# Patient Record
Sex: Female | Born: 2000 | Race: Black or African American | Hispanic: No | Marital: Single | Smoking: Never smoker
Health system: Southern US, Community
[De-identification: ages and names within clinical notes are randomized; demographics above are authoritative.]

## PROBLEM LIST (undated history)

## (undated) ENCOUNTER — Emergency Department (HOSPITAL_COMMUNITY): Admission: EM | Payer: BLUE CROSS/BLUE SHIELD

## (undated) DIAGNOSIS — G932 Benign intracranial hypertension: Secondary | ICD-10-CM

## (undated) DIAGNOSIS — J45909 Unspecified asthma, uncomplicated: Secondary | ICD-10-CM

## (undated) DIAGNOSIS — F329 Major depressive disorder, single episode, unspecified: Secondary | ICD-10-CM

## (undated) DIAGNOSIS — G509 Disorder of trigeminal nerve, unspecified: Secondary | ICD-10-CM

## (undated) DIAGNOSIS — H4711 Papilledema associated with increased intracranial pressure: Secondary | ICD-10-CM

## (undated) DIAGNOSIS — F32A Depression, unspecified: Secondary | ICD-10-CM

## (undated) DIAGNOSIS — F419 Anxiety disorder, unspecified: Secondary | ICD-10-CM

## (undated) HISTORY — DX: Depression, unspecified: F32.A

## (undated) HISTORY — DX: Major depressive disorder, single episode, unspecified: F32.9

## (undated) HISTORY — DX: Anxiety disorder, unspecified: F41.9

## (undated) HISTORY — PX: NO PAST SURGERIES: SHX2092

---
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
## 2021-01-05 ENCOUNTER — Ambulatory Visit (HOSPITAL_COMMUNITY): Payer: BC Managed Care – PPO | Admitting: Licensed Clinical Social Worker

## 2021-01-26 ENCOUNTER — Ambulatory Visit (INDEPENDENT_AMBULATORY_CARE_PROVIDER_SITE_OTHER): Payer: BC Managed Care – PPO | Admitting: Licensed Clinical Social Worker

## 2021-01-26 DIAGNOSIS — F900 Attention-deficit hyperactivity disorder, predominantly inattentive type: Secondary | ICD-10-CM

## 2021-01-27 NOTE — Progress Notes (Signed)
Virtual Visit via Video Note  I connected with Anne Ball on 01/27/21 at  2:00 PM EDT by a video enabled telemedicine application and verified that I am speaking with the correct person using two identifiers.  Location: Patient: Home Provider: Office   I discussed the limitations of evaluation and management by telemedicine and the availability of in person appointments. The patient expressed understanding and agreed to proceed.   THERAPIST PROGRESS NOTE  Session Time: 2:00 pm-2:25 pm  Type of Therapy: Individual Therapy  Session #17  Purpose of Session/Treatment Goals addressed:  "[name] will manage mood as evidenced by opening up, be more present, improve motivation, interact with her family, and have realistic expectations for herself"  Interventions: Therapist utilized CBT and Solution focused brief therapy to address focus and anxiety. Therapist provided support and empathy to patient. Therapist worked with patient to identify her barriers to completing work.   Effectiveness: Patient was oriented x5 (person, place, situation, time, and object). Patient was casually dressed, and appropriately groomed. Patient was alert, engaged, pleasant, and cooperative. Patient is still working on her school work. She is trying to manage her time. Patient knows what she needs to do and that she needs to structure her time to complete her school work. Patient continues to get distracted by things around the house.   Patient engaged in session. She responded well to interventions. Patient continues to meet criteria for Anxiety, NOS and ADHD, inattentive. Patient will continue in outpatient therapy due to being the least restrictive service to meet her needs. Patient made moderate progress on her goals at this time.   Suicidal/Homicidal: Negativewithout intent/plan  Plan: Return again in 3-4 weeks.  Diagnosis: Axis I: ADHD, inattentive type and Anxiety Disorder NOS    Axis II: No  diagnosis    I discussed the assessment and treatment plan with the patient. The patient was provided an opportunity to ask questions and all were answered. The patient agreed with the plan and demonstrated an understanding of the instructions.   The patient was advised to call back or seek an in-person evaluation if the symptoms worsen or if the condition fails to improve as anticipated.  I provided 25 minutes of non-face-to-face time during this encounter.  Bynum Bellows, LCSW 01/27/2021

## 2021-02-10 ENCOUNTER — Ambulatory Visit (INDEPENDENT_AMBULATORY_CARE_PROVIDER_SITE_OTHER): Payer: BC Managed Care – PPO | Admitting: Licensed Clinical Social Worker

## 2021-02-10 DIAGNOSIS — F900 Attention-deficit hyperactivity disorder, predominantly inattentive type: Secondary | ICD-10-CM | POA: Diagnosis not present

## 2021-02-11 NOTE — Progress Notes (Signed)
Virtual Visit via Video Note  I connected with Anne Ball on 02/11/21 at  1:00 PM EDT by a video enabled telemedicine application and verified that I am speaking with the correct person using two identifiers.  Location: Patient: Home Provider: Office   I discussed the limitations of evaluation and management by telemedicine and the availability of in person appointments. The patient expressed understanding and agreed to proceed.   THERAPIST PROGRESS NOTE  Session Time: 1:00 pm-1:15 pm  Type of Therapy: Individual Therapy  Session #18  Purpose of Session/Treatment Goals addressed:  "[name] will manage mood as evidenced by opening up, be more present, improve motivation, interact with her family, and have realistic expectations for herself"  Interventions: Therapist utilized CBT and Solution focused brief therapy to address focus and anxiety. Therapist provided support and empathy to patient. Therapist worked with patient to identify barriers that have stopper her from completing her assignments.   Effectiveness: Patient was oriented x5 (person, place, situation, time, and object). Patient was casually dressed, and appropriately groomed. Patient was alert, engaged, pleasant, and cooperative. Patient noted she has a week left to finish up her school work. Patient is trying to manage her time to complete work but hasn't done well in doing so. Patient is not scheduling or structuring her time.   Patient engaged in session. She responded well to interventions. Patient continues to meet criteria for Anxiety, NOS and ADHD, inattentive. Patient will continue in outpatient therapy due to being the least restrictive service to meet her needs. Patient made moderate progress on her goals at this time.   Suicidal/Homicidal: Negativewithout intent/plan  Plan: Return again in 3-4 weeks.  Diagnosis: Axis I: ADHD, inattentive type and Anxiety Disorder NOS    Axis II: No diagnosis    I  discussed the assessment and treatment plan with the patient. The patient was provided an opportunity to ask questions and all were answered. The patient agreed with the plan and demonstrated an understanding of the instructions.   The patient was advised to call back or seek an in-person evaluation if the symptoms worsen or if the condition fails to improve as anticipated.  I provided 15 minutes of non-face-to-face time during this encounter.  Anne Bellows, LCSW 02/11/2021

## 2021-02-15 ENCOUNTER — Telehealth: Payer: Self-pay

## 2021-02-15 ENCOUNTER — Other Ambulatory Visit (HOSPITAL_COMMUNITY): Payer: Self-pay | Admitting: Psychiatry

## 2021-02-15 DIAGNOSIS — F900 Attention-deficit hyperactivity disorder, predominantly inattentive type: Secondary | ICD-10-CM

## 2021-02-15 MED ORDER — METHYLPHENIDATE HCL ER (OSM) 54 MG PO TBCR: 54.0000 mg | EXTENDED_RELEASE_TABLET | ORAL | 0 refills | Status: DC

## 2021-02-15 NOTE — Telephone Encounter (Signed)
pt mother called left message that child needs a refill on the concerta please [facility] in [place].

## 2021-02-15 NOTE — Telephone Encounter (Signed)
sent 

## 2021-02-24 ENCOUNTER — Other Ambulatory Visit: Payer: Self-pay

## 2021-02-24 ENCOUNTER — Telehealth (INDEPENDENT_AMBULATORY_CARE_PROVIDER_SITE_OTHER): Payer: BC Managed Care – PPO | Admitting: Child and Adolescent Psychiatry

## 2021-02-24 DIAGNOSIS — F900 Attention-deficit hyperactivity disorder, predominantly inattentive type: Secondary | ICD-10-CM

## 2021-02-24 DIAGNOSIS — F840 Autistic disorder: Secondary | ICD-10-CM | POA: Diagnosis not present

## 2021-02-24 DIAGNOSIS — F331 Major depressive disorder, recurrent, moderate: Secondary | ICD-10-CM

## 2021-02-24 MED ORDER — METHYLPHENIDATE HCL ER (OSM) 54 MG PO TBCR: 54.0000 mg | EXTENDED_RELEASE_TABLET | ORAL | 0 refills | Status: DC

## 2021-02-24 MED ORDER — FLUOXETINE HCL 20 MG PO TABS: 20.0000 mg | ORAL_TABLET | Freq: Every day | ORAL | 0 refills | Status: DC

## 2021-02-24 NOTE — Progress Notes (Signed)
Virtual Visit via Video Note  I connected with [name] on 02/24/21 at 10:00 AM EDT by a video enabled telemedicine application and verified that I am speaking with the correct person using two identifiers.  Location: Patient: home Provider: office   I discussed the limitations of evaluation and management by telemedicine and the availability of in person appointments. The patient expressed understanding and agreed to proceed.     I discussed the assessment and treatment plan with the patient. The patient was provided an opportunity to ask questions and all were answered. The patient agreed with the plan and demonstrated an understanding of the instructions.   The patient was advised to call back or seek an in-person evaluation if the symptoms worsen or if the condition fails to improve as anticipated.     [name], MD     [place] MD/PA/NP OP Progress Note  02/24/21 10:00 AM Ginna Schuur  MRN:  [id number]  Chief Complaint:   Medication management follow-up for anxiety, depression, ADHD.  HPI: This is a 20 year old African-American female with psychiatric history significant for major depressive disorder, anxiety, ADHD, ASD was seen and evaluated over telemedicine encounter for medication management follow-up.  Netha was accompanied with her mother at her home and was evaluated separately from her mother.  With her verbal informed consent I spoke with her mother to discuss her treatment plan and obtain collateral information.  Tanga reports that she is doing "okay".  She reports that she found out that she did not complete the credits to finish her 12th grade and therefore cannot graduate.  She reports that she found out this few days ago and she has been feeling disappointed since then. Provided refelctive and empathic listening, and validated patient's experience.  She reports that she realizes her mistake of not doing what was required and now planning to find  alternative ways to recover these credits.  She reports that she could go to a different school because she does not want to go to the same school as she did last year.  Although she reports that she has not been feeling depressed, she reports that she has been staying in her room more than usual, sleeping more than usual especially since she found out that she has not passed 12th grade.  She reports that she still eats well, sleep routine is disrupted.  She reports her anxiety is manageable.  She denies any SI/HI.  We discussed to improve her daily routine, get out of her room more, doing some physical activity such as walking while watching TV or walking outside.  She was receptive to the suggestions.  Her mother reports that since school has been out and Jodean does not have to follow any routine she is staying in her room a lot, sleeping more than usual, was supposed to finish her 8 weeks course in 4 months however only finished 25% of it and therefore did not graduate from high school.  Mother also reports that she continues to keep her room disorganized.  She reports that she is not sure how to help patient.  We discussed to establish expectations of her behaviors around home and follow it.  Mother however reports that she has been doing it for a long time without any improvement.  We discussed to engage patient in activities outside of room.  We discussed to inquire about [facility] programm for which she was approved 10 visits for therapy and we discussed that they can also help her  with vocational rehabilitation and education needs.  She reports that she will inquire about this today.  I discussed with her to increase the dose of fluoxetine to 60 mg once a day to help her with the symptoms of depression while continuing with Concerta 54 mg once a day.  Mother reports that she has to be on top of her to have her take the medications.  Based on [place] PDMP review she does not seem to be feeling up Concerta  prescription every month.  Discussed the importance of compliance to medications.  They will follow back again in 4 weeks in person or earlier if needed.  Mother is also recommended to make an appointment with pediatrician for annual physical exam and she reports that patient has an appointment with ophthalmologist for eye checkup due to history of pseudotumor cerebri.  She continues to see her therapist about every 2 weeks.  Visit Diagnosis:    ICD-10-CM   1. Moderate episode of recurrent major depressive disorder (HCC)  F33.1     2. Attention deficit hyperactivity disorder (ADHD), predominantly inattentive type  F90.0 methylphenidate (CONCERTA) 54 MG PO CR tablet    3. Autism spectrum disorder  F84.0          Past Psychiatric History: As mentioned in initial H&P, reviewed today, no change. She continues to take Prozac 40 mg once a day, Concerta 54 mg once a day and follow-up with individual therapist, started following Mr. Bynum Bellows. Past Medical History:  Past Medical History:  Diagnosis Date   Anxiety    Asthma    Depression     Past Surgical History:  Procedure Laterality Date   NO PAST SURGERIES      Family Psychiatric History: As mentioned in initial H&P, reviewed today, no change  Family History:  Family History  Problem Relation Age of Onset   Anxiety disorder Mother    Depression Mother    Diabetes Mother    Hypertension Mother    Hyperlipidemia Mother    Asthma Father    Migraines Neg Hx    Seizures Neg Hx    Autism Neg Hx    ADD / ADHD Neg Hx    Bipolar disorder Neg Hx    Schizophrenia Neg Hx     Social History:  Social History   Socioeconomic History   Marital status: Single    Spouse name: Not on file   Number of children: Not on file   Years of education: Not on file   Highest education level: 10th grade  Occupational History   Not on file  Tobacco Use   Smoking status: Never   Smokeless tobacco: Never  Vaping Use   Vaping Use: Never  used  Substance and Sexual Activity   Alcohol use: Never   Drug use: Never   Sexual activity: Never  Other Topics Concern   Not on file  Social History Narrative   Lives with mom, dad and brother. She is in the 12th grade at [employer]. She enjoys eating, sleeping, and drawing.   Social Determinants of Health   Financial Resource Strain: Not on file  Food Insecurity: Not on file  Transportation Needs: Not on file  Physical Activity: Not on file  Stress: Not on file  Social Connections: Not on file    Allergies: No Known Allergies  Metabolic Disorder Labs: Lab Results  Component Value Date   HGBA1C 5.7 08/19/2011   No results found for: PROLACTIN Lab Results  Component  Value Date   CHOL 109 08/19/2011   TRIG 48 08/19/2011   HDL 35 (L) 08/19/2011   VLDL 10 08/19/2011   LDLCALC 64 08/19/2011   Lab Results  Component Value Date   TSH 1.065 12/29/2017    Therapeutic Level Labs: No results found for: LITHIUM No results found for: VALPROATE No components found for:  CBMZ  Current Medications: Current Outpatient Medications  Medication Sig Dispense Refill   FLUoxetine (PROZAC) 20 MG tablet Take 1 tablet (20 mg total) by mouth daily. To be taken with Prozac 40 mg daily. 30 tablet 0   acetaZOLAMIDE (DIAMOX SEQUELS) 500 MG capsule Take 1 capsule (500 mg total) by mouth 2 (two) times daily. (Patient not taking: Reported on 11/19/2020) 60 capsule 6   Albuterol (VENTOLIN IN) Inhale 2 puffs into the lungs every 4 (four) hours as needed (shortness of breath).      beclomethasone (QVAR) 40 MCG/ACT inhaler Inhale 1 puff into the lungs 2 (two) times daily.     cetirizine (ZYRTEC) 10 MG tablet Take 10 mg by mouth daily as needed for allergies.  (Patient not taking: No sig reported)     Cholecalciferol (VITAMIN D3) 50 MCG (2000 UT) capsule Take 2,000 Units by mouth 2 (two) times daily. (Patient not taking: Reported on 11/19/2020)     FLUoxetine (PROZAC) 40 MG capsule Take 1  capsule (40 mg total) by mouth daily. 90 capsule 0   fluticasone (FLONASE) 50 MCG/ACT nasal spray Place 2 sprays into both nostrils daily as needed for allergies or rhinitis.   3   loratadine (CLARITIN) 10 MG tablet Take 10 mg by mouth daily as needed for allergies or rhinitis. (Patient not taking: No sig reported)     methylphenidate (CONCERTA) 54 MG PO CR tablet Take 1 tablet (54 mg total) by mouth every morning. 30 tablet 0   methylphenidate (CONCERTA) 54 MG PO CR tablet Take 1 tablet (54 mg total) by mouth every morning. 30 tablet 0   montelukast (SINGULAIR) 10 MG tablet Take 10 mg by mouth at bedtime.     No current facility-administered medications for this visit.     Musculoskeletal: Strength & Muscle Tone: unable to assess since visit was over the telemedicine. Gait & Station: unable to assess since visit was over the telemedicine. Patient leans: N/A  Psychiatric Specialty Exam:  Mental Status Exam: Appearance: casually dressed; fairly groomed; no overt signs of trauma or distress noted, obese Attitude: calm, cooperative with good eye contact Activity: No PMA/PMR, no tics/no tremors; no EPS noted  Speech: normal rate, rhythm and volume Thought Process: Logical, linear, and goal-directed.  Associations: no looseness, tangentiality, circumstantiality, flight of ideas, thought blocking or word salad noted Thought Content: (abnormal/psychotic thoughts): no abnormal or delusional thought process evidenced SI/HI: denies Si/Hi Perception: no illusions or visual/auditory hallucinations noted; no response to internal stimuli demonstrated Mood & Affect: "ok"/full range,  Judgment & Insight: both fair Attention and Concentration : Good Cognition : WNL Language : Good ADL - Intact    Screenings:   Assessment and Plan:   This is an 20 year old African-American female with psychiatric history significant for major depressive disorder, anxiety, ADHD, ASD.  She appears to have  worsening of depressive symptoms in the context of being out of routine, not being able to complete school. Appears to have stability in anxiety. She also appears to have partial compliance to meds as she is not filling up Concerta every month. She is recommended to increase Prozac to 60  mg daily, mother will call and follow up with [facility], and recommended to set expectation for pt around the home. She will follow back in 4 weeks or early if needed.      Plan as below.   #1 Depression (recurrent, mild to moderate) - Increase Prozac to 60 mg daily  - Continue ind therapy with Mr Pollyann Savoy  - She has supportive parents which is a good prognostic and protective factor.     #2 Anxiety (stable) - Her presentation appears most consistent with generalized anxiety and social anxiety disorders. ' - Recommending medication and therapy as mentioned above.    #3 ADHD (chronic and unstable) -  Continue with Concerta 54 mg once a day  -  At the time of initiation, discussed side effects including but not limited to appetite suppression, sleep disturbances, headaches, GI side effect. Mother verbalized understanding and provided informed consent.   #4 Autism (Chronic, stable) - Testing done at [facility], and diagnosed with ASD per report, has accommodations at school.  [facility] has recommended 10 sessions of therapy, On a waitlist for vocational rehab according to mother. Mother is recommended to call and follow up.    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  - Defer management to peds neuro.    >40 minutes total time for encounter today which included chart review, pt evaluation, collaterals, medication and other treatment discussions, medication orders and charting.             [name], MD       [name], MD 02/24/21, 10:30 am

## 2021-02-25 ENCOUNTER — Ambulatory Visit (INDEPENDENT_AMBULATORY_CARE_PROVIDER_SITE_OTHER): Payer: BC Managed Care – PPO | Admitting: Licensed Clinical Social Worker

## 2021-02-25 DIAGNOSIS — F900 Attention-deficit hyperactivity disorder, predominantly inattentive type: Secondary | ICD-10-CM | POA: Diagnosis not present

## 2021-02-26 NOTE — Progress Notes (Signed)
Virtual Visit via Video Note  I connected with Anne Ball on 02/26/21 at  1:00 PM EDT by a video enabled telemedicine application and verified that I am speaking with the correct person using two identifiers.  Location: Patient: Home Provider: Office   I discussed the limitations of evaluation and management by telemedicine and the availability of in person appointments. The patient expressed understanding and agreed to proceed.   THERAPIST PROGRESS NOTE  Session Time: 1:00 pm-1:45 pm  Type of Therapy: Individual Therapy  Session #19  Purpose of Session/Treatment Goals addressed:  "Anne Ball will manage mood as evidenced by opening up, be more present, improve motivation, interact with her family, and have realistic expectations for herself"  Interventions: Therapist utilized CBT and Solution focused brief therapy to address focus and anxiety. Therapist provided support and empathy to patient during session. Therapist explored patient's feelings about not passing her class and having to retake it.   Effectiveness: Patient was oriented x5 (person, place, situation, time, and object). Patient was casually dressed, and appropriately groomed. Patient was alert, engaged, pleasant, and cooperative. Patient did not pass her chemistry class. She is going to go back to school to complete the course. She has to decide where to attend. She has been hesitant to return to her old school because she feels like her school got upset with her for standing up for her friends. She is not sure if she wants to go back there but is probably going to be going there. Patient didn't feel that frustrated about not passing. She wanted to pass but also feels like she was given too much work for a short amount of time. She didn't feel like her father and brother believe in her.   Patient engaged in session. She responded well to interventions. Patient continues to meet criteria for Anxiety, NOS and ADHD, inattentive.  Patient will continue in outpatient therapy due to being the least restrictive service to meet her needs. Patient made moderate progress on her goals at this time.   Suicidal/Homicidal: Negativewithout intent/plan  Plan: Return again in 3-4 weeks.  Diagnosis: Axis I: ADHD, inattentive type and Anxiety Disorder NOS    Axis II: No diagnosis    I discussed the assessment and treatment plan with the patient. The patient was provided an opportunity to ask questions and all were answered. The patient agreed with the plan and demonstrated an understanding of the instructions.   The patient was advised to call back or seek an in-person evaluation if the symptoms worsen or if the condition fails to improve as anticipated.  I provided 20 minutes of non-face-to-face time during this encounter.  Bynum Bellows, LCSW 02/26/2021

## 2021-03-16 ENCOUNTER — Ambulatory Visit (INDEPENDENT_AMBULATORY_CARE_PROVIDER_SITE_OTHER): Payer: BC Managed Care – PPO | Admitting: Licensed Clinical Social Worker

## 2021-03-16 DIAGNOSIS — F900 Attention-deficit hyperactivity disorder, predominantly inattentive type: Secondary | ICD-10-CM | POA: Diagnosis not present

## 2021-03-17 NOTE — Progress Notes (Signed)
Virtual Visit via Video Note  I connected with Anne Ball on 03/17/21 at 11:00 AM EDT by a video enabled telemedicine application and verified that I am speaking with the correct person using two identifiers.  Location: Patient: Home Provider: Office   I discussed the limitations of evaluation and management by telemedicine and the availability of in person appointments. The patient expressed understanding and agreed to proceed.   THERAPIST PROGRESS NOTE  Session Time: 11:00 am-11:20 am  Type of Therapy: Individual Therapy  Session #20  Purpose of Session/Treatment Goals addressed:  "Anne Ball will manage mood as evidenced by opening up, be more present, improve motivation, interact with her family, and have realistic expectations for herself"  Interventions: Therapist utilized CBT and Solution focused brief therapy to address focus and anxiety. Therapist provided support and empathy to patient during session. Therapist worked with patient to identify steps she could take take more ownership over her life.   Effectiveness: Patient was oriented x5 (person, place, situation, time, and object). Patient was casually dressed, and appropriately groomed. Patient was alert, engaged, pleasant, and cooperative. Patient noted that things have been the same. Her mood and anxiety are stable. Patient has not started school yet. She has limited information on her appointments, etc. Patient is going to ask her mother for more responsibility over her own medical information including appointments.   Patient engaged in session. She responded well to interventions. Patient continues to meet criteria for Anxiety, NOS and ADHD, inattentive. Patient will continue in outpatient therapy due to being the least restrictive service to meet her needs. Patient made moderate progress on her goals at this time.   Suicidal/Homicidal: Negativewithout intent/plan  Plan: Return again in 3-4 weeks.  Diagnosis: Axis I:  ADHD, inattentive type and Anxiety Disorder NOS    Axis II: No diagnosis    I discussed the assessment and treatment plan with the patient. The patient was provided an opportunity to ask questions and all were answered. The patient agreed with the plan and demonstrated an understanding of the instructions.   The patient was advised to call back or seek an in-person evaluation if the symptoms worsen or if the condition fails to improve as anticipated.  I provided 20 minutes of non-face-to-face time during this encounter.  Bynum Bellows, LCSW 03/17/2021

## 2021-03-28 ENCOUNTER — Other Ambulatory Visit: Payer: Self-pay | Admitting: Child and Adolescent Psychiatry

## 2021-03-29 ENCOUNTER — Encounter: Payer: Self-pay | Admitting: Child and Adolescent Psychiatry

## 2021-03-29 ENCOUNTER — Other Ambulatory Visit: Payer: Self-pay

## 2021-03-29 ENCOUNTER — Ambulatory Visit (INDEPENDENT_AMBULATORY_CARE_PROVIDER_SITE_OTHER): Payer: BC Managed Care – PPO | Admitting: Child and Adolescent Psychiatry

## 2021-03-29 VITALS — BP 144/90 | HR 137 | Temp 98.2°F | Wt 247.4 lb

## 2021-03-29 DIAGNOSIS — F331 Major depressive disorder, recurrent, moderate: Secondary | ICD-10-CM | POA: Diagnosis not present

## 2021-03-29 DIAGNOSIS — F418 Other specified anxiety disorders: Secondary | ICD-10-CM | POA: Diagnosis not present

## 2021-03-29 DIAGNOSIS — F840 Autistic disorder: Secondary | ICD-10-CM

## 2021-03-29 DIAGNOSIS — F900 Attention-deficit hyperactivity disorder, predominantly inattentive type: Secondary | ICD-10-CM

## 2021-03-29 DIAGNOSIS — Z68.41 Body mass index (BMI) pediatric, greater than or equal to 95th percentile for age: Secondary | ICD-10-CM

## 2021-03-29 MED ORDER — METHYLPHENIDATE HCL ER (OSM) 54 MG PO TBCR: 54.0000 mg | EXTENDED_RELEASE_TABLET | ORAL | 0 refills | Status: DC

## 2021-03-29 MED ORDER — FLUOXETINE HCL 40 MG PO CAPS: 40.0000 mg | ORAL_CAPSULE | Freq: Every day | ORAL | 0 refills | Status: DC

## 2021-03-29 MED ORDER — FLUOXETINE HCL 20 MG PO TABS: 20.0000 mg | ORAL_TABLET | Freq: Every day | ORAL | 0 refills | Status: DC

## 2021-03-29 NOTE — Progress Notes (Signed)
BH MD/PA/NP OP Progress Note  03/29/21 10:00 AM Anne Ball  MRN:  [id number]  Chief Complaint:  Chief Complaint   Follow-up   Medication management follow-up for anxiety, depression, ADHD.  HPI: This is an 20 year old African-American female with psychiatric history significant for major depressive disorder, anxiety, ADHD, ASD was seen and evaluated in office for medication management follow-up.  Anne Ball was accompanied with her mother, and was evaluated separately from her mother and with her verbal informed consent I spoke with her mother to obtain collateral information and discuss the treatment plan.    Anne Ball reports that she is doing "okay".  She reports that not much has changed since her appointment about a month ago.  She reports that she still spends a lot of time in her room, sleeps excessively because things are boring for her and her sleep schedule is also not in routine, reports that she started drawing again which she enjoys at times.  She reports that she watches YouTube videos which helps her learn different things.  She reports that overall her mood has been "pretty good", denies any low lows or feeling depressed.  She does report some anhedonia.  In regards of eating she reports that she often overeats.  She denies any suicidal thoughts or homicidal thoughts.  She reports that her anxiety is not much.  She reports that she spends most of the time at home and barely goes out.  She reports that she has feelings of disappointment because of not able to complete her high school last year.  Her mother provides collateral information and corroborates all areas reported.  Mother reports that things have not been different as compared to last appointment.  She reports that today she woke up, took a shower and got ready but usually she stays at home, her sleep routine is not regular, she often stays up late at night, eats a lot during the night, gained about 20 to 25 pounds in the  last 1 to 1-1/2-year.  Mother reports that she is not compliant to her medication every day and misses a few days every week.  Anne Ball disagreed with this however writer also pointed out that they are not picking up Concerta prescription every 30 days and therefore most likely she is missing some days of her medications.  She was receptive to this.  We discussed the importance of medication adherence.  She verbalized understanding.  Mother offered $1 reward every day that she takes her medication.  Patient agreed with this.  Anne Ball denies any problems with increased dose of fluoxetine after the last appointment.  We discussed her elevated blood pressure today, recommended mother to make an appointment with primary care since they have not had any annual physical this year.  Discussed with mother regarding her blood pressure today.  Mother reports that patient has an appointment with ophthalmology at the end of the month, she is still taking Diamox and she has to find an adult neurologist to follow up with that.  I discussed with patient regarding healthy eating and some exercise.  Mother offered reward to have her walk outside.  Patient agreed with this.  Anne Ball  continues to see her therapist, about every 3 to 4 weeks but it appears that therapist will be able to see her every 2 weeks after December.  We discussed to continue with current medications given her partial adherence to her medications.  Also discussed that Concerta can elevate blood pressure and heart rate and therefore strongly  recommended them to follow up with primary care regarding blood pressure and further interventions for that.  We also discussed referral to nutritionist and I will send a referral to [facility] for [facility].  Mother reports that she has reached out to [employer] and they have an online program which will allow her to take 1 subject and graduate high school.  Mother also reports that she looked at [facility] teach program  website and noticed that they have her different group programs.  Mother was strongly recommended to call and inquire about this so that it can provide more activity to patient.  Mother verbalized understanding.  They will follow back again in a month or earlier if needed.  Visit Diagnosis:    ICD-10-CM   1. Moderate episode of recurrent major depressive disorder (HCC)  F33.1     2. Attention deficit hyperactivity disorder (ADHD), predominantly inattentive type  F90.0 methylphenidate (CONCERTA) 54 MG PO CR tablet    3. Autism spectrum disorder  F84.0     4. Other specified anxiety disorders  F41.8           Past Psychiatric History: As mentioned in initial H&P, reviewed today, no change. She continues to take Prozac 40 mg once a day, Concerta 54 mg once a day and follow-up with individual therapist, started following Mr. Bynum Bellows. Past Medical History:  Past Medical History:  Diagnosis Date   Anxiety    Asthma    Depression     Past Surgical History:  Procedure Laterality Date   NO PAST SURGERIES      Family Psychiatric History: As mentioned in initial H&P, reviewed today, no change  Family History:  Family History  Problem Relation Age of Onset   Anxiety disorder Mother    Depression Mother    Diabetes Mother    Hypertension Mother    Hyperlipidemia Mother    Asthma Father    Migraines Neg Hx    Seizures Neg Hx    Autism Neg Hx    ADD / ADHD Neg Hx    Bipolar disorder Neg Hx    Schizophrenia Neg Hx     Social History:  Social History   Socioeconomic History   Marital status: Single    Spouse name: Not on file   Number of children: Not on file   Years of education: Not on file   Highest education level: 10th grade  Occupational History   Not on file  Tobacco Use   Smoking status: Never   Smokeless tobacco: Never  Vaping Use   Vaping Use: Never used  Substance and Sexual Activity   Alcohol use: Never   Drug use: Never   Sexual activity: Never   Other Topics Concern   Not on file  Social History Narrative   Lives with mom, dad and brother. She is in the 12th grade at [employer]. She enjoys eating, sleeping, and drawing.   Social Determinants of Health   Financial Resource Strain: Not on file  Food Insecurity: Not on file  Transportation Needs: Not on file  Physical Activity: Not on file  Stress: Not on file  Social Connections: Not on file    Allergies: No Known Allergies  Metabolic Disorder Labs: Lab Results  Component Value Date   HGBA1C 5.7 08/19/2011   No results found for: PROLACTIN Lab Results  Component Value Date   CHOL 109 08/19/2011   TRIG 48 08/19/2011   HDL 35 (L) 08/19/2011   VLDL  10 08/19/2011   LDLCALC 64 08/19/2011   Lab Results  Component Value Date   TSH 1.065 12/29/2017    Therapeutic Level Labs: No results found for: LITHIUM No results found for: VALPROATE No components found for:  CBMZ  Current Medications: Current Outpatient Medications  Medication Sig Dispense Refill   Albuterol (VENTOLIN IN) Inhale 2 puffs into the lungs every 4 (four) hours as needed (shortness of breath).      beclomethasone (QVAR) 40 MCG/ACT inhaler Inhale 1 puff into the lungs 2 (two) times daily.     Cholecalciferol (VITAMIN D3) 50 MCG (2000 UT) capsule Take 2,000 Units by mouth 2 (two) times daily.     fluticasone (FLONASE) 50 MCG/ACT nasal spray Place 2 sprays into both nostrils daily as needed for allergies or rhinitis.   3   montelukast (SINGULAIR) 10 MG tablet Take 10 mg by mouth at bedtime.     FLUoxetine (PROZAC) 20 MG tablet Take 1 tablet (20 mg total) by mouth daily. To be taken with Prozac 40 mg daily. 30 tablet 0   FLUoxetine (PROZAC) 40 MG capsule Take 1 capsule (40 mg total) by mouth daily. 90 capsule 0   methylphenidate (CONCERTA) 54 MG PO CR tablet Take 1 tablet (54 mg total) by mouth every morning. 30 tablet 0   No current facility-administered medications for this visit.      Musculoskeletal: Strength & Muscle Tone: WNL Gait & Station: Normal Patient leans: N/A  Psychiatric Specialty Exam:  Mental Status Exam: Appearance: casually dressed; well groomed; no overt signs of trauma or distress noted Attitude: calm, cooperative with good eye contact Activity: No PMA/PMR, no tics/no tremors; no EPS noted  Speech: normal rate, rhythm and volume Thought Process: Logical, linear, and goal-directed.  Associations: no looseness, tangentiality, circumstantiality, flight of ideas, thought blocking or word salad noted Thought Content: (abnormal/psychotic thoughts): no abnormal or delusional thought process evidenced SI/HI: denies Si/Hi Perception: no illusions or visual/auditory hallucinations noted; no response to internal stimuli demonstrated Mood & Affect: "good"/restricted Judgment & Insight: both fair Attention and Concentration : Good Cognition : WNL Language : Good ADL - Intact    Screenings:   Assessment and Plan:   This is an 20 year old African-American female with psychiatric history significant for major depressive disorder, anxiety, ADHD, ASD.  She appears to have continued depressive symptoms in the context of being out of routine, not being able to complete school. Appears to have stability in anxiety. She also appears to have partial compliance to meds as she is not filling up Concerta every month. She is recommended to continue with current meds and improve adherence at this time. She is also recommended to make appointment with primary care due to elevated blood pressure and heart rate that were noted today.  She had seen a cardiologist in the past for similar reasons and did not recommend any intervention at that time.  Mother agreed to make an appointment with primary care.  She also has an appointment with ophthalmologist and we will also get a referral for a new neurologist since she is now 19 from her primary care.  Mother will call and  follow up with [facility], and also working to get her enrolled in [employer] on line high school graduation program. She will follow back in 4 weeks or early if needed.      Plan as below.   #1 Depression (recurrent, mild to moderate) - Continue Prozac 60 mg daily and improve compliance  - Continue  ind therapy with Mr Pollyann Savoy  - She has supportive parents which is a good prognostic and protective factor.     #2 Anxiety (stable) - Her presentation appears most consistent with generalized anxiety and social anxiety disorders. ' - Recommending medication and therapy as mentioned above.    #3 ADHD (chronic and unstable) -  Continue with Concerta 54 mg once a day  -  At the time of initiation, discussed side effects including but not limited to appetite suppression, sleep disturbances, headaches, GI side effect. Mother verbalized understanding and provided informed consent.   #4 Autism (Chronic, stable) - [facility] done at [facility] and diagnosed with ASD per report, has accommodations at school.  [facility] has recommended 10 sessions of therapy, On a waitlist for vocational rehab according to mother. Mother is recommended to call and follow up.    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 Mother however reports that they have continued with diamox and planning to find a different neuro.  - Defer management to neuro.  - Referral to nutrition service sent due to obesity.    >40 minutes total time for encounter today which included chart review, pt evaluation, collaterals, medication and other treatment discussions, medication orders and charting.             [name], MD       [name], MD 03/29/21, 10:30 am

## 2021-04-01 ENCOUNTER — Ambulatory Visit (INDEPENDENT_AMBULATORY_CARE_PROVIDER_SITE_OTHER): Payer: BC Managed Care – PPO | Admitting: Licensed Clinical Social Worker

## 2021-04-01 DIAGNOSIS — F900 Attention-deficit hyperactivity disorder, predominantly inattentive type: Secondary | ICD-10-CM

## 2021-04-01 NOTE — Progress Notes (Addendum)
Virtual Visit via Video Note  I connected with Anne Ball on 04/01/21 at  1:00 PM EST by a video enabled telemedicine application and verified that I am speaking with the correct person using two identifiers.  Location: Patient: Home Provider: Office   I discussed the limitations of evaluation and management by telemedicine and the availability of in person appointments. The patient expressed understanding and agreed to proceed.   THERAPIST PROGRESS NOTE  Session Time: 1:00 pm-1:30 pm  Type of Therapy: Individual Therapy  Session #21  Purpose of Session/Treatment Goals addressed:  "Marliss will manage mood as evidenced by opening up, be more present, improve motivation, interact with her family, and have realistic expectations for herself"  Interventions: Therapist utilized CBT and Solution focused brief therapy to address focus and anxiety. Therapist provided support and empathy to patient during session. Therapist explored patient's sleep schedule and activity level.   Effectiveness: Patient was oriented x5 (person, place, situation, time, and object). Patient was alert, engaged, pleasant, and cooperative. Patient noted things have been stable. She is sleeping a lot. She is sleeping more in the day and not so much at night. Patient understood that it is important to regulate her sleep.Patient understood that she should be getting out of her room, her home, and walking. Patient is going to work on being more active and regulate sleep. Patient is going to take her medication early in the morning to help regulate sleep.   Patient engaged in session. She responded well to interventions. Patient continues to meet criteria for Anxiety, NOS and ADHD, inattentive. Patient will continue in outpatient therapy due to being the least restrictive service to meet her needs. Patient made moderate progress on her goals at this time.   Suicidal/Homicidal: Negativewithout intent/plan  Plan: Return  again in 3-4 weeks.  Diagnosis: Axis I: ADHD, inattentive type and Anxiety Disorder NOS    Axis II: No diagnosis    I discussed the assessment and treatment plan with the patient. The patient was provided an opportunity to ask questions and all were answered. The patient agreed with the plan and demonstrated an understanding of the instructions.   The patient was advised to call back or seek an in-person evaluation if the symptoms worsen or if the condition fails to improve as anticipated.  I provided 15 minutes of non-face-to-face time during this encounter.  Bynum Bellows, LCSW 04/01/2021

## 2021-04-22 ENCOUNTER — Telehealth: Payer: Self-pay

## 2021-04-22 NOTE — Telephone Encounter (Signed)
Patient prefers mother to schedule her appts with cardiology.  Ok to discuss information with her per patient verbal .  04-22-21 [employer]

## 2021-04-23 ENCOUNTER — Ambulatory Visit (INDEPENDENT_AMBULATORY_CARE_PROVIDER_SITE_OTHER): Payer: BC Managed Care – PPO | Admitting: Licensed Clinical Social Worker

## 2021-04-23 DIAGNOSIS — F900 Attention-deficit hyperactivity disorder, predominantly inattentive type: Secondary | ICD-10-CM | POA: Diagnosis not present

## 2021-04-24 NOTE — Progress Notes (Signed)
Virtual Visit via Video Note  I connected with Anne Ball on 04/24/21 at 10:00 AM EST by a video enabled telemedicine application and verified that I am speaking with the correct person using two identifiers.  Location: Patient: Home Provider: Office   I discussed the limitations of evaluation and management by telemedicine and the availability of in person appointments. The patient expressed understanding and agreed to proceed.   THERAPIST PROGRESS NOTE  Session Time: 10:00 am-10:30 am  Type of Therapy: Individual Therapy  Session #22  Purpose of Session/Treatment Goals addressed:  "Anne Ball will manage mood as evidenced by opening up, be more present, improve motivation, interact with her family, and have realistic expectations for herself"  Interventions: Therapist utilized CBT and Solution focused brief therapy to address focus and anxiety. Therapist provided support and empathy to patient during session. Therapist had patient identify what has gone well with her mood, school, and interaction with other.   Effectiveness: Patient was oriented x5 (person, place, situation, time, and object). Patient was alert, engaged, pleasant, and cooperative. Patient was casually dressed, and appropriately groomed. Patient noted that things are going well. She had a good Thanksgiving and saw family. Patient reported that her mother found her a school to attend to finish school and she will have the chance to take a different science instead of chemistry. Patient is interacting with her family more and continuing to do art.   Patient engaged in session. She responded well to interventions. Patient continues to meet criteria for Anxiety, NOS and ADHD, inattentive. Patient will continue in outpatient therapy due to being the least restrictive service to meet her needs. Patient made moderate progress on her goals at this time.   Suicidal/Homicidal: Negativewithout intent/plan  Plan: Return again in  3-4 weeks.  Diagnosis: Axis I: ADHD, inattentive type and Anxiety Disorder NOS    Axis II: No diagnosis    I discussed the assessment and treatment plan with the patient. The patient was provided an opportunity to ask questions and all were answered. The patient agreed with the plan and demonstrated an understanding of the instructions.   The patient was advised to call back or seek an in-person evaluation if the symptoms worsen or if the condition fails to improve as anticipated.  I provided 30 minutes of non-face-to-face time during this encounter.  Bynum Bellows, LCSW 04/24/2021

## 2021-04-26 ENCOUNTER — Ambulatory Visit: Payer: BC Managed Care – PPO | Admitting: Child and Adolescent Psychiatry

## 2021-04-26 ENCOUNTER — Ambulatory Visit (INDEPENDENT_AMBULATORY_CARE_PROVIDER_SITE_OTHER): Payer: BC Managed Care – PPO | Admitting: Child and Adolescent Psychiatry

## 2021-04-26 ENCOUNTER — Encounter: Payer: Self-pay | Admitting: Child and Adolescent Psychiatry

## 2021-04-26 ENCOUNTER — Other Ambulatory Visit: Payer: Self-pay

## 2021-04-26 VITALS — BP 143/85 | HR 108 | Wt 245.8 lb

## 2021-04-26 DIAGNOSIS — F418 Other specified anxiety disorders: Secondary | ICD-10-CM | POA: Diagnosis not present

## 2021-04-26 DIAGNOSIS — F840 Autistic disorder: Secondary | ICD-10-CM | POA: Diagnosis not present

## 2021-04-26 DIAGNOSIS — F900 Attention-deficit hyperactivity disorder, predominantly inattentive type: Secondary | ICD-10-CM

## 2021-04-26 DIAGNOSIS — F331 Major depressive disorder, recurrent, moderate: Secondary | ICD-10-CM

## 2021-04-26 MED ORDER — FLUOXETINE HCL 20 MG PO TABS: 20.0000 mg | ORAL_TABLET | Freq: Every day | ORAL | 0 refills | Status: DC

## 2021-04-26 MED ORDER — METHYLPHENIDATE HCL ER (OSM) 54 MG PO TBCR: 54.0000 mg | EXTENDED_RELEASE_TABLET | ORAL | 0 refills | Status: DC

## 2021-04-26 MED ORDER — FLUOXETINE HCL 40 MG PO CAPS: 40.0000 mg | ORAL_CAPSULE | Freq: Every day | ORAL | 0 refills | Status: DC

## 2021-04-26 NOTE — Progress Notes (Signed)
BH MD/PA/NP OP Progress Note  04/26/21 10:00 AM Anne Ball  MRN:  [id number]  Chief Complaint:  Chief Complaint   Follow-up    Medication management follow-up for anxiety, depression, ADHD.  HPI: This is an 20 year old African-American female with psychiatric history significant for major depressive disorder, anxiety, ADHD, ASD was seen and evaluated in office for medication management follow-up.  Anne Ball was accompanied with her mother, and was evaluated separately from her mother and with her verbal informed consent I spoke with her mother to obtain collateral information and discuss the treatment plan.    Anne Ball reports that she is doing "pretty good".  When asked what has been going good she reports that nothing bad has happened which is good and she recently got to spend some time with her grandmother and had a good time during the Thanksgiving.  She reports that she occasionally gets depressed depending on the events around her otherwise her mood has been "pretty good".  She denies any low lows or depressed mood for prolonged periods of times.  She does report that she continues to spend a lot of time in her room, sleeps a lot, does have occasional feelings of worthlessness and lack of motivation.  She denies any suicidal thoughts or homicidal thoughts.  She reports that her anxiety did go up when she was thinking about going to [employer].  Apparently she went for orientation in [employer] for her chemistry class today and it went okay.  She reports that she is also interested in one of the technical education class they offer.  She reports that she has been compliant to her medications and reward system appears to be working well for her.  She gets $1 every day she takes her medications from her mother.  She reports that she has not noticed any significant change since she has been compliant to her medications and also did not notice any side effects from them.  She continues to see a  therapist about every 2 to 3 weeks.  Her mother denies any new concerns for today's appointment.  She updates that she has reached out to [facility] and working with them to identify programs for patient.  She also reports that patient is being referred for high blood pressure and endocrinology due to A1c of 6.  Mother reports that she has not noticed significant change since she restarted taking medications on a daily basis.  We talked about other behaviors that needs to modification which includes cleaning her room.  We discussed different strategies to implement behavioral plan to motivate patient clean her room.  Anne Ball shared that she can work on this and her mother agreed to provide reward for every day she claims one thing out of her room.  We discussed that they sleep together and decide which things Anne Ball needs to clean up from her room.  They verbalized understanding and agreed to work on this.  We discussed to continue with current medications for now and adjust medication if needed at the next appointment.  They will follow back again in 6 weeks or earlier if needed.   Visit Diagnosis:    ICD-10-CM   1. Other specified anxiety disorders  F41.8     2. Attention deficit hyperactivity disorder (ADHD), predominantly inattentive type  F90.0 methylphenidate (CONCERTA) 54 MG PO CR tablet    3. Moderate episode of recurrent major depressive disorder (HCC)  F33.1     4. Autism spectrum disorder  F84.0  Past Psychiatric History: As mentioned in initial H&P, reviewed today, no change. She continues to take Prozac 40 mg once a day, Concerta 54 mg once a day and follow-up with individual therapist, started following Mr. Bynum Bellows. Past Medical History:  Past Medical History:  Diagnosis Date   Anxiety    Asthma    Depression     Past Surgical History:  Procedure Laterality Date   NO PAST SURGERIES      Family Psychiatric History: As mentioned in initial H&P,  reviewed today, no change  Family History:  Family History  Problem Relation Age of Onset   Anxiety disorder Mother    Depression Mother    Diabetes Mother    Hypertension Mother    Hyperlipidemia Mother    Asthma Father    Migraines Neg Hx    Seizures Neg Hx    Autism Neg Hx    ADD / ADHD Neg Hx    Bipolar disorder Neg Hx    Schizophrenia Neg Hx     Social History:  Social History   Socioeconomic History   Marital status: Single    Spouse name: Not on file   Number of children: Not on file   Years of education: Not on file   Highest education level: 10th grade  Occupational History   Not on file  Tobacco Use   Smoking status: Never   Smokeless tobacco: Never  Vaping Use   Vaping Use: Never used  Substance and Sexual Activity   Alcohol use: Never   Drug use: Never   Sexual activity: Never  Other Topics Concern   Not on file  Social History Narrative   Lives with mom, dad and brother. She is in the 12th grade at [employer]. She enjoys eating, sleeping, and drawing.   Social Determinants of Health   Financial Resource Strain: Not on file  Food Insecurity: Not on file  Transportation Needs: Not on file  Physical Activity: Not on file  Stress: Not on file  Social Connections: Not on file    Allergies: No Known Allergies  Metabolic Disorder Labs: Lab Results  Component Value Date   HGBA1C 5.7 08/19/2011   No results found for: PROLACTIN Lab Results  Component Value Date   CHOL 109 08/19/2011   TRIG 48 08/19/2011   HDL 35 (L) 08/19/2011   VLDL 10 08/19/2011   LDLCALC 64 08/19/2011   Lab Results  Component Value Date   TSH 1.065 12/29/2017    Therapeutic Level Labs: No results found for: LITHIUM No results found for: VALPROATE No components found for:  CBMZ  Current Medications: Current Outpatient Medications  Medication Sig Dispense Refill   Albuterol (VENTOLIN IN) Inhale 2 puffs into the lungs every 4 (four) hours as needed  (shortness of breath).      beclomethasone (QVAR) 40 MCG/ACT inhaler Inhale 1 puff into the lungs 2 (two) times daily.     Cholecalciferol (VITAMIN D3) 50 MCG (2000 UT) capsule Take 2,000 Units by mouth 2 (two) times daily.     fluticasone (FLONASE) 50 MCG/ACT nasal spray Place 2 sprays into both nostrils daily as needed for allergies or rhinitis.   3   montelukast (SINGULAIR) 10 MG tablet Take 10 mg by mouth at bedtime.     FLUoxetine (PROZAC) 20 MG tablet Take 1 tablet (20 mg total) by mouth daily. To be taken with Prozac 40 mg daily. 30 tablet 0   FLUoxetine (PROZAC) 40 MG capsule Take 1 capsule (  40 mg total) by mouth daily. 90 capsule 0   methylphenidate (CONCERTA) 54 MG PO CR tablet Take 1 tablet (54 mg total) by mouth every morning. 30 tablet 0   No current facility-administered medications for this visit.     Musculoskeletal: Strength & Muscle Tone: WNL Gait & Station: Normal Patient leans: N/A  Psychiatric Specialty Exam: Today's Vitals   04/26/21 1431  BP: (!) 143/85  Pulse: (!) 108  Weight: 245 lb 12.8 oz (111.5 kg)   Body mass index is 51.25 kg/m.   Mental Status Exam: Appearance: casually dressed; obese; fairly groomed; no overt signs of trauma or distress noted Attitude: calm, cooperative with good eye contact Activity: No PMA/PMR, no tics/no tremors; no EPS noted  Speech: normal rate, rhythm and volume Thought Process: Logical, linear, and goal-directed.  Associations: no looseness, tangentiality, circumstantiality, flight of ideas, thought blocking or word salad noted Thought Content: (abnormal/psychotic thoughts): no abnormal or delusional thought process evidenced SI/HI: denies Si/Hi Perception: no illusions or visual/auditory hallucinations noted; no response to internal stimuli demonstrated Mood & Affect: "good"/full range Judgment & Insight: both fair Attention and Concentration : Good Cognition : WNL Language : Good ADL - Intact      Screenings:   Assessment and Plan:   This is an 20 year old African-American female with psychiatric history significant for major depressive disorder, anxiety, ADHD, ASD.  She appears to have improved compliance to the medication, has mild improvement with her mood however continues to struggle with lack of motivation, occasional feelings of worthlessness.  She does seem to be starting her remaining high school class which would allow her to have some structured activities.  Mother is also trying to get resources from [facility].  At this time recommended to continue with current medications.  She continues to see her therapist about every 2 to 3 weeks.  Her blood pressure was elevated today as well and she is now referred by her pediatrician to a cardiologist and also to endocrinologist for elevated A1c.  They will follow back with me again in 6 weeks currently if needed.       Plan as below.   #1 Depression (recurrent, mild to moderate) - Continue Prozac 60 mg daily and improve compliance  - Continue ind therapy with Mr Pollyann Savoy  - She has supportive parents which is a good prognostic and protective factor.     #2 Anxiety (stable) - Her presentation appears most consistent with generalized anxiety and social anxiety disorders. ' - Recommending medication and therapy as mentioned above.    #3 ADHD (chronic and unstable) -  Continue with Concerta 54 mg once a day  -  At the time of initiation, discussed side effects including but not limited to appetite suppression, sleep disturbances, headaches, GI side effect. Mother verbalized understanding and provided informed consent.   #4 Autism (Chronic, stable) - Testing done at [facility], and diagnosed with ASD per report, has accommodations at school.  [facility] has recommended 10 sessions of therapy, On a waitlist for vocational rehab according to mother. Mother is following up with them.    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 Mother however reports that they have continued with diamox and planning to find a different neuro.  - Defer management to neuro.  - Referral to nutrition service sent due to obesity.    30 minutes total  time for encounter today which included chart review, pt evaluation, collaterals, medication and other treatment discussions, family counseling, medication orders and charting.            [name], MD       [name], MD 04/26/21, 10:30 am

## 2021-05-05 ENCOUNTER — Ambulatory Visit (HOSPITAL_COMMUNITY): Payer: BC Managed Care – PPO | Admitting: Licensed Clinical Social Worker

## 2021-05-25 ENCOUNTER — Ambulatory Visit (INDEPENDENT_AMBULATORY_CARE_PROVIDER_SITE_OTHER): Payer: BC Managed Care – PPO | Admitting: Licensed Clinical Social Worker

## 2021-05-25 DIAGNOSIS — F900 Attention-deficit hyperactivity disorder, predominantly inattentive type: Secondary | ICD-10-CM

## 2021-05-26 NOTE — Progress Notes (Signed)
Virtual Visit via Video Note  I connected with Anne Ball on 05/26/21 at  3:00 PM EST by a video enabled telemedicine application and verified that I am speaking with the correct person using two identifiers.  Location: Patient: Home Provider: Office   I discussed the limitations of evaluation and management by telemedicine and the availability of in person appointments. The patient expressed understanding and agreed to proceed.   THERAPIST PROGRESS NOTE  Session Time: 3:00 pm-3:30 pm  Type of Therapy: Individual Therapy  Session #23  Purpose of Session/Treatment Goals addressed:  "Anne Ball will manage mood as evidenced by opening up, be more present, improve motivation, interact with her family, and have realistic expectations for herself"  Interventions: Therapist utilized CBT and Solution focused brief therapy to address focus and anxiety. Therapist provided support and empathy to patient. Therapist explored patient's feelings to identify triggers. Therapist had patient identify areas she needs to work on to improve her overall wellness.   Effectiveness: Patient was oriented x5 (person, place, situation, time, and object). Patient was casually dressed, and appropriately groomed. Patient was alert, engaged, pleasant, and cooperative. Patient had a good holiday. She enjoyed seeing family. She is waiting to start her new school program to finish school up. Patient is looking forward to completing school and knows she needs to manage her time by setting aside time to do her school work. She also admits that she is not taking care of herself physically. She knows she needs to move more and go for a walk.   Patient engaged in session. She responded well to interventions. Patient continues to meet criteria for Anxiety, NOS and ADHD, inattentive. Patient will continue in outpatient therapy due to being the least restrictive service to meet her needs. Patient made moderate progress on her goals  at this time.   Suicidal/Homicidal: Negativewithout intent/plan  Plan: Return again in 3-4 weeks.  Diagnosis: Axis I: ADHD, inattentive type and Anxiety Disorder NOS    Axis II: No diagnosis    I discussed the assessment and treatment plan with the patient. The patient was provided an opportunity to ask questions and all were answered. The patient agreed with the plan and demonstrated an understanding of the instructions.   The patient was advised to call back or seek an in-person evaluation if the symptoms worsen or if the condition fails to improve as anticipated.  I provided 30 minutes of non-face-to-face time during this encounter.  Glori Bickers, LCSW 05/26/2021

## 2021-05-31 ENCOUNTER — Encounter: Payer: Self-pay | Admitting: Cardiovascular Disease

## 2021-05-31 ENCOUNTER — Ambulatory Visit: Payer: BC Managed Care – PPO | Admitting: Cardiovascular Disease

## 2021-05-31 ENCOUNTER — Other Ambulatory Visit: Payer: Self-pay

## 2021-05-31 VITALS — BP 120/72 | HR 86 | Ht <= 58 in | Wt 250.5 lb

## 2021-05-31 DIAGNOSIS — F3341 Major depressive disorder, recurrent, in partial remission: Secondary | ICD-10-CM

## 2021-05-31 DIAGNOSIS — F900 Attention-deficit hyperactivity disorder, predominantly inattentive type: Secondary | ICD-10-CM | POA: Diagnosis not present

## 2021-05-31 DIAGNOSIS — Z01810 Encounter for preprocedural cardiovascular examination: Secondary | ICD-10-CM

## 2021-05-31 DIAGNOSIS — R030 Elevated blood-pressure reading, without diagnosis of hypertension: Secondary | ICD-10-CM

## 2021-05-31 NOTE — Patient Instructions (Signed)
Medication Instructions:  No changes  If you need a refill on your cardiac medications before your next appointment, please call your pharmacy.   Lab work: No new labs needed  Testing/Procedures: No new testing needed  Follow-Up: At [facility], you and your health needs are our priority.  As part of our continuing mission to provide you with exceptional heart care, we have created designated Provider Care Teams.  These Care Teams include your primary Cardiologist (physician) and Advanced Practice Providers (APPs -  Physician Assistants and Nurse Practitioners) who all work together to provide you with the care you need, when you need it.  You will need a follow up appointment as needed  Providers on your designated Care Team:   Christopher Berge, NP Ryan Dunn, PA-C Cadence Furth, PA-C  COVID-19 Vaccine Information can be found at: [email or web address] For questions related to vaccine distribution or appointments, please email [email or web address] or call [phone].    

## 2021-05-31 NOTE — Progress Notes (Signed)
Cardiology Office Note  Date:  05/31/2021   ID:  Anne Ball, DOB 01-31-2001, MRN [id number]  PCP:  Anne Newport, NP   Chief Complaint  Patient presents with   New Patient (Initial Visit)    Ref by Anne Ganja, NP for HTN. "Doing well." Medications reviewed by the patient's mother Anne Ball) verbally.     HPI:  Anne Ball is a 21 year old woman with past medical history of Attention deficit hyperactivity disorder autism spectrum disorder, ADHD, asthma, pseudotumor cerebri,  Referred by Anne Ball for consultation of her elevated blood pressure without diagnosis of hypertension, morbid obesity  [name] reports that she is very inactive, No regular exercise program, stays mainly in the house Weight has been trending upwards, notes indicate 20 pound weight gain over the past 6 months Likes to do artistic things, arts and crafts, less outdoor stuff  Denies any chest pain or shortness of breath, no significant leg swelling Has not been checking blood pressure at home Several blood pressures in the computer is 120 systolic over 70-80  Blood pressure today 120/70 She does own a blood pressure cuff, just does not use it  EKG personally reviewed by myself on todays visit Normal sinus rhythm rate 86 bpm no significant ST-T wave changes   PMH:   has a past medical history of Anxiety, Asthma, and Depression.  PSH:    Past Surgical History:  Procedure Laterality Date   NO PAST SURGERIES      Current Outpatient Medications  Medication Sig Dispense Refill   Albuterol (VENTOLIN IN) Inhale 2 puffs into the lungs every 4 (four) hours as needed (shortness of breath).      beclomethasone (QVAR) 40 MCG/ACT inhaler Inhale 1 puff into the lungs 2 (two) times daily.     Cholecalciferol (VITAMIN D3) 50 MCG (2000 UT) capsule Take 2,000 Units by mouth 2 (two) times daily.     FLUoxetine (PROZAC) 20 MG tablet Take 1 tablet (20 mg total) by mouth daily. To be taken with Prozac 40  mg daily. 30 tablet 0   FLUoxetine (PROZAC) 40 MG capsule Take 1 capsule (40 mg total) by mouth daily. 90 capsule 0   methylphenidate (CONCERTA) 54 MG PO CR tablet Take 1 tablet (54 mg total) by mouth every morning. 30 tablet 0   montelukast (SINGULAIR) 10 MG tablet Take 10 mg by mouth at bedtime.     fluticasone (FLONASE) 50 MCG/ACT nasal spray Place 2 sprays into both nostrils daily as needed for allergies or rhinitis.  (Patient not taking: Reported on 05/31/2021)  3   No current facility-administered medications for this visit.     Allergies:   Patient has no known allergies.   Social History:  The patient  reports that she has never smoked. She has never used smokeless tobacco. She reports that she does not drink alcohol and does not use drugs.   Family History:   family history includes Anxiety disorder in her mother; Asthma in her father; Depression in her mother; Diabetes in her mother; Hyperlipidemia in her mother; Hypertension in her mother.    Review of Systems: ROS   PHYSICAL EXAM: VS:  BP 120/72 (BP Location: Right Arm, Patient Position: Sitting, Cuff Size: Large)    Pulse 86    Ht 4\' 10"  (1.473 m)    Wt 250 lb 8 oz (113.6 kg)    SpO2 99%    BMI 52.35 kg/m  , BMI Body mass index is 52.35 kg/m. GEN: Well  nourished, well developed, in no acute distress HEENT: normal Neck: no JVD, carotid bruits, or masses Cardiac: RRR; no murmurs, rubs, or gallops,no edema  Respiratory:  clear to auscultation bilaterally, normal work of breathing GI: soft, nontender, nondistended, + BS MS: no deformity or atrophy Skin: warm and dry, no rash Neuro:  Strength and sensation are intact Psych: euthymic mood, full affect  Recent Labs: No results found for requested labs within last 8760 hours.    Lipid Panel Lab Results  Component Value Date   CHOL 109 08/19/2011   HDL 35 (L) 08/19/2011   LDLCALC 64 08/19/2011   TRIG 48 08/19/2011      Wt Readings from Last 3 Encounters:  05/31/21  250 lb 8 oz (113.6 kg) (>99 %, Z= 2.48)*  11/19/20 231 lb 14.8 oz (105.2 kg) (99 %, Z= 2.31)*  04/20/20 (!) 220 lb 14.4 oz (100.2 kg) (99 %, Z= 2.21)*   * Growth percentiles are based on CDC (Girls, 2-20 Years) data.    ASSESSMENT AND PLAN:  Problem List Items Addressed This Visit     Recurrent major depressive disorder, in partial remission (HCC)   Attention deficit hyperactivity disorder (ADHD), predominantly inattentive type   Other Visit Diagnoses     Elevated blood pressure reading    -  Primary   Relevant Orders   EKG 12-Lead   Pre-procedural cardiovascular examination          Concern for elevated blood pressure Blood pressure well controlled today No clear documentation of high blood pressures Recommend she start monitoring blood pressure at home, discussed blood pressure guidelines Would not start medications based off today's numbers, no further cardiac work-up needed Prior echocardiogram reviewed, normal study --We have recommended weight loss, lifestyle modification, exercise program  autism spectrum disorder, ADHD, asthma, pseudotumor cerebri,  Followed by psychiatry  Morbid obesity We have encouraged continued exercise, careful diet management in an effort to lose weight.   Total encounter time more than 60 minutes  Greater than 50% was spent in counseling and coordination of care with the patient  Patient was seen in consultation for Anne Ball and will be referred back to her office for ongoing care of the issues detailed above  Signed, Dossie Arbour, M.D., Ph.D. [facility] [place], [place] [phone]

## 2021-06-05 ENCOUNTER — Other Ambulatory Visit: Payer: Self-pay | Admitting: Child and Adolescent Psychiatry

## 2021-06-07 ENCOUNTER — Ambulatory Visit (INDEPENDENT_AMBULATORY_CARE_PROVIDER_SITE_OTHER): Payer: BC Managed Care – PPO | Admitting: Child and Adolescent Psychiatry

## 2021-06-07 ENCOUNTER — Other Ambulatory Visit: Payer: Self-pay

## 2021-06-07 VITALS — BP 108/74 | HR 86 | Temp 97.1°F | Ht <= 58 in | Wt 249.0 lb

## 2021-06-07 DIAGNOSIS — F840 Autistic disorder: Secondary | ICD-10-CM | POA: Diagnosis not present

## 2021-06-07 DIAGNOSIS — F418 Other specified anxiety disorders: Secondary | ICD-10-CM

## 2021-06-07 DIAGNOSIS — F900 Attention-deficit hyperactivity disorder, predominantly inattentive type: Secondary | ICD-10-CM | POA: Diagnosis not present

## 2021-06-07 DIAGNOSIS — F3341 Major depressive disorder, recurrent, in partial remission: Secondary | ICD-10-CM | POA: Diagnosis not present

## 2021-06-07 MED ORDER — METHYLPHENIDATE HCL ER (OSM) 54 MG PO TBCR: 54.0000 mg | EXTENDED_RELEASE_TABLET | ORAL | 0 refills | Status: DC

## 2021-06-07 NOTE — Progress Notes (Signed)
BH MD/PA/NP OP Progress Note  06/07/21 9:00 AM Anne Ball  MRN:  [id number]  Chief Complaint:    Medication management follow-up for ADHD, anxiety, depression.  HPI: This is an 21 year old African-American female with psychiatric history significant for major depressive disorder, anxiety, ADHD, ASD was seen and evaluated in office for medication management follow-up.  Anne Ball was accompanied with her mother and was seen and evaluated jointly with her mother and alone.  She has signed written consent to have her mother involved in her treatment.  Anne Ball denies any new concerns for today's appointment.  She reports that she lost her cat about 3 days ago and since then she has been feeling sad and crying often.  She reports that she was very close to her cat. Provided refelctive and empathic listening, and validated patient's experience.  She reports that prior to that she was doing well, denied feeling depressed however has been spending a lot of time in her room.  She reports that she is spending time in her room to do her schoolwork and also play animal Crossing.  She reports that she sleeps mostly at night, denies excessive sleepiness.  She denies any suicidal thoughts or homicidal thoughts.  She continues to struggle with cleaning her room.  She reports that her anxiety has been higher since her cat died 3 days ago however prior to that anxiety has been minimal.  She reports that she has been compliant with her medications except the last 3 days.  Her mother corroborates that since the cat died she has been more tearful, and sad.  She has been communicating with her to express her feelings and Anne Ball reports that it has been helpful.  Mother reports that around her birthday and Christmas she was more social but since then she has been staying inside her room more.  She reports 1 incident in which Anne Ball demanded that they go out to get the food at 9:00 p.m.  I discussed with Anne Ball about  working on managing the distress if things does not go her way.  We discussed to think and come self down.  She was receptive to this.  In regards of medications we discussed to continue with current medications.  She has seen a cardiologist for elevated blood pressure.  Her blood pressure was normal as well as her EKG at that appointment and she was recommended to have follow-up as needed.  She has an appointment with endocrinology for elevated hemoglobin A1c this month.  She continues to see her therapist about every 2 weeks.  She has started going to college to finish her high school, does work from home, has been able to pay attention and reports that she is doing well with that.  Visit Diagnosis:    ICD-10-CM   1. Autism spectrum disorder  F84.0     2. Attention deficit hyperactivity disorder (ADHD), predominantly inattentive type  F90.0 methylphenidate (CONCERTA) 54 MG PO CR tablet    3. Other specified anxiety disorders  F41.8     4. Recurrent major depressive disorder, in partial remission (HCC)  F33.41             Past Psychiatric History: As mentioned in initial H&P, reviewed today, no change. She continues to take Prozac 40 mg once a day, Concerta 54 mg once a day and follow-up with individual therapist, started following Mr. Bynum Bellows. Past Medical History:  Past Medical History:  Diagnosis Date   Anxiety    Asthma  Depression     Past Surgical History:  Procedure Laterality Date   NO PAST SURGERIES      Family Psychiatric History: As mentioned in initial H&P, reviewed today, no change  Family History:  Family History  Problem Relation Age of Onset   Anxiety disorder Mother    Depression Mother    Diabetes Mother    Hypertension Mother    Hyperlipidemia Mother    Asthma Father    Migraines Neg Hx    Seizures Neg Hx    Autism Neg Hx    ADD / ADHD Neg Hx    Bipolar disorder Neg Hx    Schizophrenia Neg Hx     Social History:  Social History    Socioeconomic History   Marital status: Single    Spouse name: Not on file   Number of children: Not on file   Years of education: Not on file   Highest education level: 10th grade  Occupational History   Not on file  Tobacco Use   Smoking status: Never   Smokeless tobacco: Never  Vaping Use   Vaping Use: Never used  Substance and Sexual Activity   Alcohol use: Never   Drug use: Never   Sexual activity: Never  Other Topics Concern   Not on file  Social History Narrative   Lives with mom, dad and brother. She is in the 12th grade at [employer]. She enjoys eating, sleeping, and drawing.   Social Determinants of Health   Financial Resource Strain: Not on file  Food Insecurity: Not on file  Transportation Needs: Not on file  Physical Activity: Not on file  Stress: Not on file  Social Connections: Not on file    Allergies: No Known Allergies  Metabolic Disorder Labs: Lab Results  Component Value Date   HGBA1C 5.7 08/19/2011   No results found for: PROLACTIN Lab Results  Component Value Date   CHOL 109 08/19/2011   TRIG 48 08/19/2011   HDL 35 (L) 08/19/2011   VLDL 10 08/19/2011   LDLCALC 64 08/19/2011   Lab Results  Component Value Date   TSH 1.065 12/29/2017    Therapeutic Level Labs: No results found for: LITHIUM No results found for: VALPROATE No components found for:  CBMZ  Current Medications: Current Outpatient Medications  Medication Sig Dispense Refill   Albuterol (VENTOLIN IN) Inhale 2 puffs into the lungs every 4 (four) hours as needed (shortness of breath).      beclomethasone (QVAR) 40 MCG/ACT inhaler Inhale 1 puff into the lungs 2 (two) times daily.     Cholecalciferol (VITAMIN D3) 50 MCG (2000 UT) capsule Take 2,000 Units by mouth 2 (two) times daily.     FLUoxetine (PROZAC) 20 MG tablet TAKE 1 TABLET (20 MG TOTAL) BY MOUTH DAILY. TO BE TAKEN WITH PROZAC 40 MG DAILY. 30 tablet 0   FLUoxetine (PROZAC) 40 MG capsule Take 1 capsule (40  mg total) by mouth daily. 90 capsule 0   fluticasone (FLONASE) 50 MCG/ACT nasal spray Place 2 sprays into both nostrils daily as needed for allergies or rhinitis.  (Patient not taking: Reported on 05/31/2021)  3   methylphenidate (CONCERTA) 54 MG PO CR tablet Take 1 tablet (54 mg total) by mouth every morning. 30 tablet 0   montelukast (SINGULAIR) 10 MG tablet Take 10 mg by mouth at bedtime.     No current facility-administered medications for this visit.     Musculoskeletal: Strength & Muscle Tone: WNL Gait &  Station: Normal Patient leans: N/A  Psychiatric Specialty Exam: Today's Vitals   06/07/21 0859  BP: 108/74  Pulse: 86  Temp: (!) 97.1 F (36.2 C)  TempSrc: Temporal  SpO2: 97%  Weight: 249 lb (112.9 kg)  Height: 4\' 10"  (1.473 m)  PainSc: 0-No pain    Body mass index is 52.04 kg/m.  Mental Status Exam: Appearance: casually dressed; fairly groomed; obese; no overt signs of trauma or distress noted Attitude: calm, cooperative with fair eye contact Activity: No PMA/PMR, no tics/no tremors; no EPS noted  Speech: normal rate, rhythm and volume Thought Process: Logical, linear, and goal-directed.  Associations: no looseness, tangentiality, circumstantiality, flight of ideas, thought blocking or word salad noted Thought Content: (abnormal/psychotic thoughts): no abnormal or delusional thought process evidenced SI/HI: denies Si/Hi Perception: no illusions or visual/auditory hallucinations noted; no response to internal stimuli demonstrated Mood & Affect: "good"/full range, neutral Judgment & Insight: both fair Attention and Concentration : Good Cognition : WNL Language : Good ADL - Intact     Screenings:   Assessment and Plan:   This is an 21 year old African-American female with psychiatric history significant for major depressive disorder, anxiety, ADHD, ASD.  She appears to have improved compliance to the medication, appears to have mild improvement with her mood,  doing school through [employer] to graduate HS. Continues to see therapist every 2 weeks. They will follow back with me again in 6 weeks currently if needed.       Plan as below.   #1 Depression (recurrent, mild) - Continue Prozac 60 mg daily and improve compliance  - Continue ind therapy with Mr Pollyann SavoySheets  - She has supportive parents which is a good prognostic and protective factor.     #2 Anxiety (stable) - Her presentation appears most consistent with generalized anxiety and social anxiety disorders. ' - Recommending medication and therapy as mentioned above.    #3 ADHD (chronic and unstable) -  Continue with Concerta 54 mg once a day  -  At the time of initiation, discussed side effects including but not limited to appetite suppression, sleep disturbances, headaches, GI side effect. Mother verbalized understanding and provided informed consent.   #4 Autism (Chronic, stable) - Testing done at [facility], and diagnosed with ASD per report, has accommodations at school.  [facility] has recommended 10 sessions of therapy, On a waitlist for vocational rehab according to mother. Mother to follow up up with them, has not done it yet, encouraged her to touch base with them.    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 Mother however reports that they have continued with diamox and planning to find a different neuro.  - Defer management to neuro.  - Referral to nutrition service sent due to obesity, but has not followed.    30 minutes total time for encounter today which included chart review, pt evaluation, collaterals, medication and other treatment discussions, family counseling, medication orders and charting.            Darcel SmallingHiren M Jashay Roddy, MD       Darcel SmallingHiren M Ameliana Brashear, MD 06/07/21, 10:30 am

## 2021-06-08 ENCOUNTER — Ambulatory Visit (INDEPENDENT_AMBULATORY_CARE_PROVIDER_SITE_OTHER): Payer: BC Managed Care – PPO | Admitting: Licensed Clinical Social Worker

## 2021-06-08 DIAGNOSIS — F840 Autistic disorder: Secondary | ICD-10-CM

## 2021-06-08 DIAGNOSIS — F900 Attention-deficit hyperactivity disorder, predominantly inattentive type: Secondary | ICD-10-CM

## 2021-06-09 NOTE — Progress Notes (Signed)
Virtual Visit via Video Note  I connected with [name] on 06/09/21 at  2:00 PM EST by a video enabled telemedicine application and verified that I am speaking with the correct person using two identifiers.  Location: Patient: Home Provider: Office   I discussed the limitations of evaluation and management by telemedicine and the availability of in person appointments. The patient expressed understanding and agreed to proceed.   THERAPIST PROGRESS NOTE  Session Time: 2:00 pm-2:25 pm  Type of Therapy: Individual Therapy  Session #24  Purpose of Session/Treatment Goals addressed:  "Joreen will manage mood as evidenced by opening up, be more present, improve motivation, interact with her family, and have realistic expectations for herself"  Interventions: Therapist utilized CBT and Solution focused brief therapy to address focus and anxiety. Therapist provided support and empathy to patient during session. Therapist explored triggers for mood. Therapist had patient identify what has gone well and what she needs to focus on to be successful.   Effectiveness: Patient was oriented x5 (person, place, situation, time, and object). Patient was casually dressed, and appropriately groomed. Patient was alert, engaged, pleasant, and cooperative. Patient noted that things have been good overall but she did lose her pet cat. Her cat got injured and had to be put down which upset her. Patient has started school. She is doing well so far with school. Patient is liking her new science class now and feels like she will do well. Patient is going to focus on her time management to complete school work.   Patient engaged in session. She responded well to interventions. Patient continues to meet criteria for Anxiety, NOS and ADHD, inattentive. Patient will continue in outpatient therapy due to being the least restrictive service to meet her needs. Patient made moderate progress on her goals at this time.    Suicidal/Homicidal: Negativewithout intent/plan  Plan: Return again in 3-4 weeks.  Diagnosis: Axis I: ADHD, inattentive type and Anxiety Disorder NOS    Axis II: No diagnosis    I discussed the assessment and treatment plan with the patient. The patient was provided an opportunity to ask questions and all were answered. The patient agreed with the plan and demonstrated an understanding of the instructions.   The patient was advised to call back or seek an in-person evaluation if the symptoms worsen or if the condition fails to improve as anticipated.  I provided 25 minutes of non-face-to-face time during this encounter.  Bynum Bellows, LCSW 06/09/2021

## 2021-06-22 ENCOUNTER — Ambulatory Visit (INDEPENDENT_AMBULATORY_CARE_PROVIDER_SITE_OTHER): Payer: BC Managed Care – PPO | Admitting: Licensed Clinical Social Worker

## 2021-06-22 DIAGNOSIS — F900 Attention-deficit hyperactivity disorder, predominantly inattentive type: Secondary | ICD-10-CM | POA: Diagnosis not present

## 2021-06-23 NOTE — Progress Notes (Signed)
Virtual Visit via Video Note  I connected with Anne Ball on 06/23/21 at  3:00 PM EST by a video enabled telemedicine application and verified that I am speaking with the correct person using two identifiers.  Location: Patient: Home Provider: Office   I discussed the limitations of evaluation and management by telemedicine and the availability of in person appointments. The patient expressed understanding and agreed to proceed.   THERAPIST PROGRESS NOTE  Session Time: 2:50 pm-3:15 pm  Type of Therapy: Individual Therapy  Session #25  Purpose of Session/Treatment Goals addressed:  "[place] will manage mood as evidenced by opening up, be more present, improve motivation, interact with her family, and have realistic expectations for herself"  Interventions: Therapist utilized CBT and Solution focused brief therapy to address focus and anxiety. Therapist provided support and empathy to patient during session. Therapist explored patient's mood to identify triggers. Therapist worked with patient to identify what works for her to complete school work and using anxiety as a Regulatory affairs officer.   Effectiveness: Patient was oriented x5 (person, place, situation, time, and object). Patient was casually dressed, and appropriately groomed. Patient was alert, engaged, pleasant, and cooperative. Patient denied depressed mood. She was feeling better related to the passing of her cat. Patient has some mild anxiety about school but she understood that she needs to use her anxiety as a motivator and not an obstacle. She understood that anxiety can motivate her to study, and stay on top of her work. Patient is enjoying her classes and can receive money for completing her school work. If she finishes all of her course work in the next week she can get $100 from the school, and after that it drops to $50. Patient is confident in her ability to finish school.   Patient engaged in session. She responded well to  interventions. Patient continues to meet criteria for Anxiety, NOS and ADHD, inattentive. Patient will continue in outpatient therapy due to being the least restrictive service to meet her needs. Patient made moderate progress on her goals at this time.   Suicidal/Homicidal: Negativewithout intent/plan  Plan: Return again in 3-4 weeks.  Diagnosis: Axis I: ADHD, inattentive type and Anxiety Disorder NOS    Axis II: No diagnosis    I discussed the assessment and treatment plan with the patient. The patient was provided an opportunity to ask questions and all were answered. The patient agreed with the plan and demonstrated an understanding of the instructions.   The patient was advised to call back or seek an in-person evaluation if the symptoms worsen or if the condition fails to improve as anticipated.  I provided 25 minutes of non-face-to-face time during this encounter.  Bynum Bellows, LCSW 06/23/2021

## 2021-07-01 ENCOUNTER — Other Ambulatory Visit: Payer: Self-pay

## 2021-07-01 ENCOUNTER — Encounter: Payer: Self-pay | Admitting: Dietician

## 2021-07-01 ENCOUNTER — Encounter: Payer: BC Managed Care – PPO | Attending: Pediatrics | Admitting: Dietician

## 2021-07-01 VITALS — Ht 59.0 in | Wt 257.6 lb

## 2021-07-01 DIAGNOSIS — Z6841 Body Mass Index (BMI) 40.0 and over, adult: Secondary | ICD-10-CM | POA: Diagnosis not present

## 2021-07-01 DIAGNOSIS — F840 Autistic disorder: Secondary | ICD-10-CM | POA: Diagnosis not present

## 2021-07-01 DIAGNOSIS — R030 Elevated blood-pressure reading, without diagnosis of hypertension: Secondary | ICD-10-CM

## 2021-07-01 NOTE — Progress Notes (Signed)
Medical Nutrition Therapy: Visit start time: 0930  end time: 1030  Assessment:  Diagnosis: obesity, elevated BP Past medical history: autism, mild asthma, ADHD, pseudotumor cerebri Psychosocial issues/ stress concerns: autism, ADHD, depression  Preferred learning method:  Auditory (discussion) Visual Hands-on   Current weight: 257.6 (with shoes, items in pockets)     Height: 4'11" BMI: 52  Medications, supplements: patient has not been taking her medications recently (per mother)  Progress and evaluation:  Patient and mother report significant weight gain since Anne Ball has begun taking online classes to complete her high school education. She is walking and moving less.  Parents have been encouraging her to exercise, but she has not been motivated to do so.  Lives with both parents and a brother.  Anne Ball prepares lunch meal for herself, eats dinner with family but sometimes prepares her own plate from frozen convenience foods if she does not like what her mom has cooked.   Physical activity: no regular activity, occasionally walks dog  Dietary Intake:  Usual eating pattern includes 2 meals and 1 snacks per day. Dining out frequency: 2 meals per week.  Breakfast: usually skips Snack: none Lunch: 12-2pm frozen foods chicken nuggets, fries/ pizza/ other foods Snack: none Supper: mom cooks -- ie pasta; otherwise frozen foods; occ out [employer] sandwich/ nuggets and fries Snack: sometimes nighttime eating if slept late; frozen foods Beverages: water about 64oz daily, rarely soda (loves sodas)  Nutrition Care Education: Topics covered:  Basic nutrition: basic food groups, appropriate nutrient balance, appropriate meal and snack schedule, general nutrition guidelines    Weight control: importance of low sugar and low fat choices; portion control strategies including starting with smaller portions, incorporating low carb vegetables; choosing grilled and baked options vs fried, breaded;  limiting added fats ie Ranch dressing; role of exercise/ activity and options for increasing daily activity Blood pressure:  importance of controlling BP, identifying high sodium foods, identifying food sources of potassium, magnesium Other lifestyle changes:  benefits of making changes, increasing motivation, readiness for change, identifying habits that need to change  Nutritional Diagnosis:  [place]-2.1 Inpaired nutrition utilization As related to elevated BP.  As evidenced by previous BP readings. [place]-3.3 Overweight/obesity As related to excess calories, inadequate physical activity.  As evidenced by patient with current BMI of 52.  Intervention:  Instruction and discussion as noted above. Patient voices some resistance to making lifestyle changes. Discussed making gradual changes, using incentives/ rewards, working with current preferences and personal goals to boost motivation. Established goals for change with input from patient and mother  Education Materials given:  Designer, industrial/product with food lists [employer] (benefits of exercise handout) 100 Ways to add 2000 Steps handout Visit summary with goals/ instructions   Learner/ who was taught:  Patient  Family member: mother Anne Ball   Level of understanding: Verbalizes/ demonstrates competency   Demonstrated degree of understanding via:   Teach back Learning barriers: Motivation low, autism spectrum disorder  Willingness to learn/ readiness for change: Hesitance, contemplating change   Monitoring and Evaluation:  Dietary intake, exercise, BP control, and body weight      follow up:  08/27/21 at 9:15am

## 2021-07-01 NOTE — Patient Instructions (Signed)
Eat smaller portions of foods by cooking less, added less to the plate.  Add low carb vegetable like celery, carrot sticks, or green beans and/or a fruit Choose grilled meats over breaded and fried.  Use only small amounts of ranch when needed for dipping veggies. Take dog(s) out for walks, think of other ways to add movement and steps in the day.  Try exercise video on You Tube. Track exercise minutes, and work out a reward system to help with staying motivated.

## 2021-07-19 ENCOUNTER — Other Ambulatory Visit: Payer: Self-pay

## 2021-07-19 ENCOUNTER — Ambulatory Visit (INDEPENDENT_AMBULATORY_CARE_PROVIDER_SITE_OTHER): Payer: BC Managed Care – PPO | Admitting: Child and Adolescent Psychiatry

## 2021-07-19 ENCOUNTER — Encounter: Payer: Self-pay | Admitting: Child and Adolescent Psychiatry

## 2021-07-19 VITALS — BP 146/82 | HR 102 | Temp 97.9°F | Wt 258.6 lb

## 2021-07-19 DIAGNOSIS — F3341 Major depressive disorder, recurrent, in partial remission: Secondary | ICD-10-CM | POA: Diagnosis not present

## 2021-07-19 DIAGNOSIS — F840 Autistic disorder: Secondary | ICD-10-CM

## 2021-07-19 DIAGNOSIS — F418 Other specified anxiety disorders: Secondary | ICD-10-CM

## 2021-07-19 DIAGNOSIS — F900 Attention-deficit hyperactivity disorder, predominantly inattentive type: Secondary | ICD-10-CM

## 2021-07-19 MED ORDER — METHYLPHENIDATE HCL ER (OSM) 54 MG PO TBCR: 54.0000 mg | EXTENDED_RELEASE_TABLET | ORAL | 0 refills | Status: DC

## 2021-07-19 MED ORDER — FLUOXETINE HCL 40 MG PO CAPS: 40.0000 mg | ORAL_CAPSULE | Freq: Every day | ORAL | 0 refills | Status: DC

## 2021-07-19 MED ORDER — FLUOXETINE HCL 20 MG PO TABS
20.0000 mg | ORAL_TABLET | Freq: Every day | ORAL | 0 refills | Status: DC
Start: 2021-07-19 — End: 2021-08-16

## 2021-07-19 NOTE — Progress Notes (Signed)
BH MD/PA/NP OP Progress Note  07/19/21 9:00 AM Anne Ball  MRN:  [id number]  Chief Complaint:  Chief Complaint   Follow-up     Medication management follow-up for ADHD, anxiety, depression.  HPI: This is an 21 year old African-American female with psychiatric history significant for major depressive disorder, anxiety, ADHD, ASD was seen and evaluated in office for medication management follow-up.  Anne Ball was accompanied with her mother and was evaluated alone and with her informed consent I spoke with her and mother jointly.   In the interim since last appointment, according to chart review she had an appointment with dietitian for her weight gain and also continued to see her therapist about every 2 weeks but have not seen since last month.  Mother also reported that patient was seen at [facility] endocrine clinic for obesity and now diagnosed with prediabetes as her A1c was 6.0.  She is started on metformin and is supposed to be taking 1000 mg twice a day and also take injection Wegovy 0.25 mg every week.   Anne Ball reports that she is doing "good".  She denies any low lows or depressive episodes since the last appointment.  She reports that her mood has been "good".  She also reports that her anxiety has been pretty low.  She reports that she has tried cleaning her room however gets distracted and then does not finish it.  She reports that she is doing her school work, it has been going well and reports that she will be able to graduate on time.  She reports that she enjoys spending time playing video games especially [employer] Cross[employer].  She reports that she sleeps well, denies any tiredness.  She reports that she has been eating well.  She denies any suicidal thoughts or homicidal thoughts.  She reports that for the past week she has been taking her medication however was noncompliant prior to that.  Her mother reports that that after an argument about 3 weeks ago patient refused to talk to her  mother, and refused to take her medications.  Mother reports that for the past week she has been taking her medications as prescribed.  Mother reports that she does not know how she is doing in school because [employer] does not give her any information since patient is 4.  I discussed with Anne Ball regarding,  improving communication with her mother and importance of medication compliance.  She was receptive to this.  Mother reports that she heard back from [facility] regarding their life skills and job skills program however not sure if patient is motivated to do it.  We discussed to at least give it a try.  They both verbalized understanding and agreed to work on this.  We discussed to continue his current medication and improve the compliance to medication.  We also discussed on healthy eating habits, exercising since patient has gained more weight since last appointment.  They will follow back again in a month or earlier if needed.  Visit Diagnosis:    ICD-10-CM   1. Recurrent major depressive disorder, in partial remission (HCC)  F33.41     2. Attention deficit hyperactivity disorder (ADHD), predominantly inattentive type  F90.0 methylphenidate (CONCERTA) 54 MG PO CR tablet    3. Autism spectrum disorder  F84.0     4. Other specified anxiety disorders  F41.8        Past Psychiatric History: As mentioned in initial H&P, reviewed today, no change. She continues to take Prozac 40  mg once a day, Concerta 54 mg once a day and follow-up with individual therapist, started following Mr. Bynum Bellows. Past Medical History:  Past Medical History:  Diagnosis Date   Anxiety    Asthma    Depression     Past Surgical History:  Procedure Laterality Date   NO PAST SURGERIES      Family Psychiatric History: As mentioned in initial H&P, reviewed today, no change  Family History:  Family History  Problem Relation Age of Onset   Anxiety disorder Mother    Depression Mother    Diabetes Mother     Hypertension Mother    Hyperlipidemia Mother    Asthma Father    Migraines Neg Hx    Seizures Neg Hx    Autism Neg Hx    ADD / ADHD Neg Hx    Bipolar disorder Neg Hx    Schizophrenia Neg Hx     Social History:  Social History   Socioeconomic History   Marital status: Single    Spouse name: Not on file   Number of children: Not on file   Years of education: Not on file   Highest education level: 10th grade  Occupational History   Not on file  Tobacco Use   Smoking status: Never   Smokeless tobacco: Never  Vaping Use   Vaping Use: Never used  Substance and Sexual Activity   Alcohol use: Never   Drug use: Never   Sexual activity: Never  Other Topics Concern   Not on file  Social History Narrative   Lives with mom, dad and brother. She is in the 12th grade at [employer]. She enjoys eating, sleeping, and drawing.   Social Determinants of Health   Financial Resource Strain: Not on file  Food Insecurity: Not on file  Transportation Needs: Not on file  Physical Activity: Not on file  Stress: Not on file  Social Connections: Not on file    Allergies: No Known Allergies  Metabolic Disorder Labs: Lab Results  Component Value Date   HGBA1C 5.7 08/19/2011   No results found for: PROLACTIN Lab Results  Component Value Date   CHOL 109 08/19/2011   TRIG 48 08/19/2011   HDL 35 (L) 08/19/2011   VLDL 10 08/19/2011   LDLCALC 64 08/19/2011   Lab Results  Component Value Date   TSH 1.065 12/29/2017    Therapeutic Level Labs: No results found for: LITHIUM No results found for: VALPROATE No components found for:  CBMZ  Current Medications: Current Outpatient Medications  Medication Sig Dispense Refill   Albuterol (VENTOLIN IN) Inhale 2 puffs into the lungs every 4 (four) hours as needed (shortness of breath).      Cholecalciferol (VITAMIN D3) 50 MCG (2000 UT) capsule Take 2,000 Units by mouth 2 (two) times daily.     fluticasone (FLONASE) 50 MCG/ACT  nasal spray Place 2 sprays into both nostrils daily as needed for allergies or rhinitis.  3   montelukast (SINGULAIR) 10 MG tablet Take 10 mg by mouth at bedtime.     QVAR REDIHALER 80 MCG/ACT inhaler Inhale 2 puffs into the lungs 2 (two) times daily.     FLUoxetine (PROZAC) 20 MG tablet Take 1 tablet (20 mg total) by mouth daily. To be taken with Prozac 40 mg daily. 30 tablet 0   FLUoxetine (PROZAC) 40 MG capsule Take 1 capsule (40 mg total) by mouth daily. 90 capsule 0   methylphenidate (CONCERTA) 54 MG PO CR tablet Take  1 tablet (54 mg total) by mouth every morning. 30 tablet 0   No current facility-administered medications for this visit.     Musculoskeletal: Strength & Muscle Tone: WNL Gait & Station: Normal Patient leans: N/A  Psychiatric Specialty Exam: Today's Vitals   07/19/21 0844 07/19/21 0845  BP: (!) 146/82   Pulse: (!) 102   Temp: 97.9 F (36.6 C)   TempSrc: Temporal   Weight: 258 lb 9.6 oz (117.3 kg)   PainSc:  0-No pain     Body mass index is 52.23 kg/m.  Mental Status Exam: Appearance: casually dressed; obese, fairly groomed; no overt signs of trauma or distress noted Attitude: calm, cooperative with good eye contact Activity: No PMA/PMR, no tics/no tremors; no EPS noted  Speech: normal rate, rhythm and volume Thought Process: Logical, linear, and goal-directed.  Associations: no looseness, tangentiality, circumstantiality, flight of ideas, thought blocking or word salad noted Thought Content: (abnormal/psychotic thoughts): no abnormal or delusional thought process evidenced SI/HI: denies Si/Hi Perception: no illusions or visual/auditory hallucinations noted; no response to internal stimuli demonstrated Mood & Affect: "good"/full range, neutral Judgment & Insight: both fair Attention and Concentration : Good Cognition : WNL Language : Good ADL - Intact     Screenings: PHQ2-9    Flow[facility] Row [facility]  [place]  PHQ-2 Total Score 0        Assessment and Plan:   This is an 21 year old African-American female with psychiatric history significant for major depressive disorder, anxiety, ADHD, ASD.  She continues to struggle with medication adherence, and motivation to do daily chores/exercise/healthy eating but enjoys other preferred activities. Doing school through [employer] to graduate HS. She is recommended to improve compliance to medication and continue to see therapist every 2 weeks. They will follow back with me again in 6-8 weeks currently if needed.       Plan as below.   #1 Depression (recurrent, in remission) - Continue Prozac 60 mg daily and improve compliance  - Continue ind therapy with Mr Pollyann SavoySheets  - She has supportive parents which is a good prognostic and protective factor.     #2 Anxiety (stable) - Her presentation appears most consistent with generalized anxiety and social anxiety disorders. ' - Recommending medication and therapy as mentioned above.    #3 ADHD (chronic and unstable) -  Continue with Concerta 54 mg once a day  -  At the time of initiation, discussed side effects including but not limited to appetite suppression, sleep disturbances, headaches, GI side effect. Mother verbalized understanding and provided informed consent.   #4 Autism (Chronic, stable) - Testing done at [facility], and diagnosed with ASD per report, has accommodations at school.  [facility] previously recommended 10 sessions of therapy but they have never done it. Vocational rehab program recently reached out to them, and they agreed to look into this.     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 Mother however reports that they have continued with diamox and planning to find a different neuro.  - Defer management to neuro.  - Following with endo  and nutrition service due to obesity and prediabetes.    30 minutes total time for encounter today which included chart review, pt evaluation, collaterals, medication and other treatment discussions, family counseling, medication orders and charting.  [name], MD       [name], MD 07/19/21, 10:30 am

## 2021-07-20 ENCOUNTER — Ambulatory Visit (HOSPITAL_COMMUNITY): Payer: BC Managed Care – PPO | Admitting: Licensed Clinical Social Worker

## 2021-08-03 ENCOUNTER — Ambulatory Visit (HOSPITAL_COMMUNITY): Payer: BC Managed Care – PPO | Admitting: Licensed Clinical Social Worker

## 2021-08-16 ENCOUNTER — Ambulatory Visit (INDEPENDENT_AMBULATORY_CARE_PROVIDER_SITE_OTHER): Payer: BC Managed Care – PPO | Admitting: Child and Adolescent Psychiatry

## 2021-08-16 ENCOUNTER — Encounter: Payer: Self-pay | Admitting: Child and Adolescent Psychiatry

## 2021-08-16 ENCOUNTER — Other Ambulatory Visit: Payer: Self-pay

## 2021-08-16 VITALS — BP 152/94 | HR 128 | Temp 97.3°F | Wt 252.6 lb

## 2021-08-16 DIAGNOSIS — F418 Other specified anxiety disorders: Secondary | ICD-10-CM

## 2021-08-16 DIAGNOSIS — F900 Attention-deficit hyperactivity disorder, predominantly inattentive type: Secondary | ICD-10-CM | POA: Diagnosis not present

## 2021-08-16 DIAGNOSIS — F840 Autistic disorder: Secondary | ICD-10-CM

## 2021-08-16 DIAGNOSIS — F3341 Major depressive disorder, recurrent, in partial remission: Secondary | ICD-10-CM | POA: Diagnosis not present

## 2021-08-16 MED ORDER — FLUOXETINE HCL 20 MG PO TABS: 20.0000 mg | ORAL_TABLET | Freq: Every day | ORAL | 0 refills | Status: DC

## 2021-08-16 MED ORDER — METHYLPHENIDATE HCL ER (OSM) 54 MG PO TBCR: 54.0000 mg | EXTENDED_RELEASE_TABLET | ORAL | 0 refills | Status: DC

## 2021-08-16 NOTE — Progress Notes (Signed)
?BH MD/PA/NP OP Progress Note ? ?08/16/21 9:00 AM ?Anne Ball  ?MRN:  [id number] ? ?Chief Complaint:  ?Chief Complaint   ?Follow-up ?  ? ?Medication management follow-up for ADHD, anxiety and depression. ? ?HPI: This is a 21 year old African-American female with psychiatric history significant for major depressive disorder, anxiety, ADHD, ASD was seen and evaluated in office for medication management follow-up. ? ?Anne Ball was accompanied with her mother for her follow-up appointment and with her informed verbal consent I spoke with her mother along with Maleigha after speaking with Dominick alone. ? ?Lizette reports that she has been doing "pretty good", denies any low lows, however reports occasional sadness and anhedonia.  She reports that she is sleeping well however sometimes sleeps too much and continues to have low energy.  She denies any problems with eating and has occasional feelings of worthlessness.  She does report having problems with concentration.  She denies any suicidal thoughts.  She reports that she does not have a set schedule on waking up and therefore she takes all her medications at night including her Concerta.  Discussed that Concerta is supposed to be taken in the morning which would help her focus well and also would not impact her sleep.  She reports that she is still not consistently taking her medications and misses about 2 days a week.  I discussed in detail and provided psychoeducation on medication adherence and to take the medication as prescribed.  She was receptive to this.  She denies excessive worries or feelings of nervousness. ? ?At home she reports that things are going okay for her, she is not having problems with her family members, has been recently spending more time going out to her brother's soccer games and with her family.  She reports that she is still struggling with cleaning her room and some other chores.  She reports that in her free time she has been doing her  schoolwork, watching movies and playing video games. ? ?Her mother denies any new concerns for today's appointment, reports that she was seen by endocrinologist in the interim since last appointment and notes suggest that she was started on metformin and Wegovy.  She has started taking metformin however has not been able to start [facility] because of coverage problems with her insurance.  Apparently patient lost about 6 pounds since last appointment and also has seen a nutritionist since then.  She is also not taking metformin as prescribed, discussed importance of adherence to metformin as well.  Mother also reports that she continues to struggle with cleaning and her chores.  She reports that she has spoken with [facility] and patient will be starting their group therapy when it will be available.  I discussed the importance of adherence to medication and recommended to continue with current medications.  They will follow back in 6 weeks or earlier if needed. ? ?Visit Diagnosis:  ?  ICD-10-CM   ?1. Other specified anxiety disorders  F41.8   ?  ?2. Attention deficit hyperactivity disorder (ADHD), predominantly inattentive type  F90.0 methylphenidate (CONCERTA) 54 MG PO CR tablet  ?  ?3. Recurrent major depressive disorder, in partial remission (HCC)  F33.41   ?  ?4. Autism spectrum disorder  F84.0   ?  ? ? ?Past Psychiatric History: As mentioned in initial H&P, reviewed today, no change. ?She continues to take Prozac 40 mg once a day, Concerta 54 mg once a day and follow-up with individual therapist, started following Mr. Bynum Bellows. ?Past  Medical History:  ?Past Medical History:  ?Diagnosis Date  ? Anxiety   ? Asthma   ? Depression   ?  ?Past Surgical History:  ?Procedure Laterality Date  ? NO PAST SURGERIES    ? ? ?Family Psychiatric History: As mentioned in initial H&P, reviewed today, no change  ?Family History:  ?Family History  ?Problem Relation Age of Onset  ? Anxiety disorder Mother   ? Depression Mother    ? Diabetes Mother   ? Hypertension Mother   ? Hyperlipidemia Mother   ? Asthma Father   ? Migraines Neg Hx   ? Seizures Neg Hx   ? Autism Neg Hx   ? ADD / ADHD Neg Hx   ? Bipolar disorder Neg Hx   ? Schizophrenia Neg Hx   ? ? ?Social History:  ?Social History  ? ?Socioeconomic History  ? Marital status: Single  ?  Spouse name: Not on file  ? Number of children: Not on file  ? Years of education: Not on file  ? Highest education level: 10th grade  ?Occupational History  ? Not on file  ?Tobacco Use  ? Smoking status: Never  ? Smokeless tobacco: Never  ?Vaping Use  ? Vaping Use: Never used  ?Substance and Sexual Activity  ? Alcohol use: Never  ? Drug use: Never  ? Sexual activity: Never  ?Other Topics Concern  ? Not on file  ?Social History Narrative  ? Lives with mom, dad and brother. She is in the 12th grade at [employer]. She enjoys eating, sleeping, and drawing.  ? ?Social Determinants of Health  ? ?Financial Resource Strain: Not on file  ?Food Insecurity: Not on file  ?Transportation Needs: Not on file  ?Physical Activity: Not on file  ?Stress: Not on file  ?Social Connections: Not on file  ? ? ?Allergies: No Known Allergies ? ?Metabolic Disorder Labs: ?Lab Results  ?Component Value Date  ? HGBA1C 5.7 08/19/2011  ? ?No results found for: PROLACTIN ?Lab Results  ?Component Value Date  ? CHOL 109 08/19/2011  ? TRIG 48 08/19/2011  ? HDL 35 (L) 08/19/2011  ? VLDL 10 08/19/2011  ? LDLCALC 64 08/19/2011  ? ?Lab Results  ?Component Value Date  ? TSH 1.065 12/29/2017  ? ? ?Therapeutic Level Labs: ?No results found for: LITHIUM ?No results found for: VALPROATE ?No components found for:  CBMZ ? ?Current Medications: ?Current Outpatient Medications  ?Medication Sig Dispense Refill  ? Albuterol (VENTOLIN IN) Inhale 2 puffs into the lungs every 4 (four) hours as needed (shortness of breath).     ? Cholecalciferol (VITAMIN D3) 50 MCG (2000 UT) capsule Take 2,000 Units by mouth 2 (two) times daily.    ? FLUoxetine  (PROZAC) 40 MG capsule Take 1 capsule (40 mg total) by mouth daily. 90 capsule 0  ? montelukast (SINGULAIR) 10 MG tablet Take 10 mg by mouth at bedtime.    ? QVAR REDIHALER 80 MCG/ACT inhaler Inhale 2 puffs into the lungs 2 (two) times daily.    ? FLUoxetine (PROZAC) 20 MG tablet Take 1 tablet (20 mg total) by mouth daily. To be taken with Prozac 40 mg daily. 90 tablet 0  ? fluticasone (FLONASE) 50 MCG/ACT nasal spray Place 2 sprays into both nostrils daily as needed for allergies or rhinitis.  3  ? methylphenidate (CONCERTA) 54 MG PO CR tablet Take 1 tablet (54 mg total) by mouth every morning. 30 tablet 0  ? ?No current facility-administered medications for this visit.  ? ? ? ?  Musculoskeletal: ?Strength & Muscle Tone: WNL ?Gait & Station: Normal ?Patient leans: N/A ? ?Psychiatric Specialty Exam: ?Today's Vitals  ? 08/16/21 0805 08/16/21 0809  ?BP: (!) 152/94   ?Pulse: (!) 128   ?Temp: (!) 97.3 ?F (36.3 ?C)   ?TempSrc: Temporal   ?Weight: 252 lb 9.6 oz (114.6 kg)   ?PainSc:  0-No pain  ? ? ? ?Body mass index is 51.02 kg/m?. ? ?Mental Status Exam: ?Appearance: casually dressed; obese; no overt signs of trauma or distress noted ?Attitude: calm, cooperative with fair eye contact ?Activity: No PMA/PMR, no tics/no tremors; no EPS noted  ?Speech: normal rate, rhythm and volume ?Thought Process: Logical, linear, and goal-directed.  ?Associations: no looseness, tangentiality, circumstantiality, flight of ideas, thought blocking or word salad noted ?Thought Content: (abnormal/psychotic thoughts): no abnormal or delusional thought process evidenced ?SI/HI: denies Si/Hi ?Perception: no illusions or visual/auditory hallucinations noted; no response to internal stimuli demonstrated ?Mood & Affect: "good"/full range, neutral ?Judgment & Insight: both fair ?Attention and Concentration : Good ?Cognition : WNL ?Language : Good ?ADL - Intact  ? ? ? ?Screenings: ?PHQ2-9   ? ?Flowsheet Row Nutrition [facility]/20[facility] [place]  ?PHQ-2 Total Score 0  ? ?  ? ? ? ?Assessment and Plan:  ? ?This is an 21 year old African-American female with psychiatric history significant for major depressive disorder, anxiety, ADHD, ASD.

## 2021-08-24 ENCOUNTER — Ambulatory Visit (INDEPENDENT_AMBULATORY_CARE_PROVIDER_SITE_OTHER): Payer: BC Managed Care – PPO | Admitting: Licensed Clinical Social Worker

## 2021-08-24 ENCOUNTER — Encounter (HOSPITAL_COMMUNITY): Payer: Self-pay

## 2021-08-24 DIAGNOSIS — F900 Attention-deficit hyperactivity disorder, predominantly inattentive type: Secondary | ICD-10-CM | POA: Diagnosis not present

## 2021-08-24 NOTE — Progress Notes (Signed)
Virtual Visit via Video Note ? ?I connected with Anne Ball on 08/24/21 at  1:00 PM EDT by a video enabled telemedicine application and verified that I am speaking with the correct person using two identifiers. ? ?Location: ?Patient: Home ?Provider: Office ?  ?I discussed the limitations of evaluation and management by telemedicine and the availability of in person appointments. The patient expressed understanding and agreed to proceed. ? ? ?THERAPIST PROGRESS NOTE ? ?Session Time: 2:00 pm-2:30 pm ? ?Type of Therapy: Individual Therapy ? ?Session #26 ? ?Purpose of Session/Treatment Goals addressed:  ?"Anne Ball will manage mood as evidenced by opening up, be more present, improve motivation, interact with her family, and have realistic expectations for herself" ? ?Interventions: Therapist utilized CBT and Solution focused brief therapy to address focus and anxiety. Therapist provided support and empathy to patient. Therapist updated patient's treatment plan.   ? ?Effectiveness: Patient was oriented x4 (person, place, situation, and time). Patient was casually dressed, and appropriately groomed. Patient was alert, engaged, pleasant, and cooperative. Patient was doing well. She has been working hard on her Financial [employer] course. Patient is working on her school work about an hour a day. Patient feels like she has made progress on her goals. A new goal was added to her treatment plan.  ? ?Patient engaged in session. She responded well to interventions. Patient continues to meet criteria for Anxiety, NOS and ADHD, inattentive. Patient will continue in outpatient therapy due to being the least restrictive service to meet her needs. Patient made moderate progress on her goals at this time.  ? ?Suicidal/Homicidal: Negativewithout intent/plan ? ?Plan: Return again in 3-4 weeks. Patient will focus on time management skills and completing school work daily.  ? ?Diagnosis: Axis I: ADHD, inattentive type and Anxiety  Disorder NOS ? ?  Axis II: No diagnosis ? ?  ?I discussed the assessment and treatment plan with the patient. The patient was provided an opportunity to ask questions and all were answered. The patient agreed with the plan and demonstrated an understanding of the instructions. ?  ?The patient was advised to call back or seek an in-person evaluation if the symptoms worsen or if the condition fails to improve as anticipated. ? ?I provided 30 minutes of non-face-to-face time during this encounter. ? ?Bynum Bellows, LCSW ?08/24/2021 ?

## 2021-08-24 NOTE — Plan of Care (Signed)
?  Problem: Anxiety Disorder CCP Problem  1 Manage worry and focus ?Goal:  Mairyn will sustain attention and concentration for consistently longer periods of time and increase the frequency of on-task behaviors. ?Outcome: Progressing ?Goal: STG: Patient will practice problem solving skills 3 times per week for the next 4 weeks ?Outcome: Progressing ?  ?

## 2021-08-27 ENCOUNTER — Ambulatory Visit: Payer: BC Managed Care – PPO | Admitting: Dietician

## 2021-09-16 ENCOUNTER — Encounter: Payer: Self-pay | Admitting: Dietician

## 2021-09-16 NOTE — Progress Notes (Signed)
Patient cancelled her follow up MNT visit on 08/27/21 and has not rescheduled. Sent notification to referring provider. ?

## 2021-09-20 ENCOUNTER — Ambulatory Visit (INDEPENDENT_AMBULATORY_CARE_PROVIDER_SITE_OTHER): Payer: BC Managed Care – PPO | Admitting: Licensed Clinical Social Worker

## 2021-09-20 DIAGNOSIS — F900 Attention-deficit hyperactivity disorder, predominantly inattentive type: Secondary | ICD-10-CM

## 2021-09-21 NOTE — Progress Notes (Signed)
Virtual Visit via Video Note ? ?I connected with Anne Ball on 09/21/21 at  1:00 PM EDT by a video enabled telemedicine application and verified that I am speaking with the correct person using two identifiers. ? ?Location: ?Patient: Home ?Provider: Office ?  ?I discussed the limitations of evaluation and management by telemedicine and the availability of in person appointments. The patient expressed understanding and agreed to proceed. ? ? ?THERAPIST PROGRESS NOTE ? ?Session Time: 1:00 pm-1:30 pm ? ?Type of Therapy: Individual Therapy ? ?Session #27 ? ?Purpose of Session/Treatment Goals addressed:  ?"Anne Ball will manage mood as evidenced by opening up, be more present, improve motivation, interact with her family, and have realistic expectations for herself" ? ?Interventions: Therapist utilized CBT and Solution focused brief therapy to address focus and anxiety. Therapist provided support and empathy to patient during session. Therapist worked with patient to identify ways to improve her time management.   ? ?Effectiveness: Patient was oriented x4 (person, place, situation, and time). Patient was casually dressed, and appropriately groomed. Patient was alert, engaged, pleasant, and cooperative. Patient has a few weeks left in her semester. She is focusing on increasing her time committed to her school work. Patient is interacting with her family. Patient understood that she needs to increase her movement, and do her chores. Patient said that her mother doesn't give her a lot of chores even though she asks for them.   ? ?Patient engaged in session. She responded well to interventions. Patient continues to meet criteria for Anxiety, NOS and ADHD, inattentive. Patient will continue in outpatient therapy due to being the least restrictive service to meet her needs. Patient made moderate progress on her goals at this time.  ? ?Suicidal/Homicidal: Negativewithout intent/plan ? ?Plan: Return again in 3-4 weeks. Patient  will continue to focus on time management.   ? ?Diagnosis: Axis I: ADHD, inattentive type and Anxiety Disorder NOS ? ?  Axis II: No diagnosis ? ?  ?I discussed the assessment and treatment plan with the patient. The patient was provided an opportunity to ask questions and all were answered. The patient agreed with the plan and demonstrated an understanding of the instructions. ?  ?The patient was advised to call back or seek an in-person evaluation if the symptoms worsen or if the condition fails to improve as anticipated. ? ?I provided 30 minutes of non-face-to-face time during this encounter. ? ?Bynum Bellows, LCSW ?09/21/2021 ?

## 2021-09-27 ENCOUNTER — Ambulatory Visit (INDEPENDENT_AMBULATORY_CARE_PROVIDER_SITE_OTHER): Payer: BC Managed Care – PPO | Admitting: Child and Adolescent Psychiatry

## 2021-09-27 ENCOUNTER — Encounter: Payer: Self-pay | Admitting: Child and Adolescent Psychiatry

## 2021-09-27 VITALS — BP 141/89 | HR 80 | Temp 98.8°F | Wt 245.8 lb

## 2021-09-27 DIAGNOSIS — F840 Autistic disorder: Secondary | ICD-10-CM

## 2021-09-27 DIAGNOSIS — F330 Major depressive disorder, recurrent, mild: Secondary | ICD-10-CM | POA: Diagnosis not present

## 2021-09-27 DIAGNOSIS — F418 Other specified anxiety disorders: Secondary | ICD-10-CM | POA: Diagnosis not present

## 2021-09-27 DIAGNOSIS — F900 Attention-deficit hyperactivity disorder, predominantly inattentive type: Secondary | ICD-10-CM

## 2021-09-27 NOTE — Progress Notes (Signed)
?BH MD/PA/NP OP Progress Note ? ?09/27/21 9:00 AM ?Anne Ball  ?MRN:  [id number] ? ?Chief Complaint:  ?Chief Complaint   ?Follow-up ?  ? ?Medication management follow-up for ADHD, anxiety and depression. ? ?HPI: This is a 21 year old African-American female with psychiatric history significant for major depressive disorder, anxiety, ADHD, ASD was seen and evaluated in office for medication management follow-up. ? ?[name] was accompanied with her mother for her follow-up appointment and with her verbal and written informed consent I spoke with her mother along with Anne Ball after speaking with Anne Ball alone. ? ?Anne Ball denies any new concerns for today's appointment and reports that she is "pretty all right".  She reports that her mood has been "pretty good", denies any low lows or depressive episodes.  She does report occasional depressed mood.  She has been spending time doing her schoolwork and drawing.  Her mother however reports that she has only done 12 to 13 hours of school work since January and she was expected to do 10 hours of school work every week.  Although he reports that this is the last week before she could graduate from that program at [employer] and planning to finish old schoolwork that she is due this week.  Her mother however reports that she is not sure whether this is realistically possible and therefore has encouraged patient to reach out to instructor at the [facility] [employer] [facility] to see other alternatives.  ? ?She reports that she has healthy amount of anxiety related to school which has been helpful to get some schoolwork done.  She denies any excessive worries.  She does report some anhedonia, some difficulties with sleep, eating well and has lost about 7 pounds since the last appointment.  She denies any SI or HI.  She denies problems with energy however continues to struggle with motivation to keep her room clean.  Her mother had to spend a lot of time on the weekend to clean her room.  We discussed the  importance of keeping the environment clean around her and discussed how she can take care of the chores to keep her room clean.  She was receptive to this. ? ?She and her mother both report that patient has been more compliant to her medication.  Allyce reports that ADHD medications helps her stay alert and organized as well as more attentive. ? ?We discussed to continue with current medications. ? ?I encouraged mother to restart autism [employer] of [place] for resources for [facility]. as well as [facility].  ? ?Visit Diagnosis:  ?  ICD-10-CM   ?1. Other specified anxiety disorders  F41.8   ?  ?2. Mild episode of recurrent major depressive disorder (HCC)  F33.0   ?  ?3. Attention deficit hyperactivity disorder (ADHD), predominantly inattentive type  F90.0   ?  ?4. Autism spectrum disorder  F84.0   ?  ? ? ? ?Past Psychiatric History: As mentioned in initial H&P, reviewed today, no change. ?She continues to take Prozac 40 mg once a day, Concerta 54 mg once a day and follow-up with individual therapist, started following Mr. Glori Bickers. ?Past Medical History:  ?Past Medical History:  ?Diagnosis Date  ? Anxiety   ? Asthma   ? Depression   ?  ?Past Surgical History:  ?Procedure Laterality Date  ? NO PAST SURGERIES    ? ? ?Family Psychiatric History: As mentioned in initial H&P, reviewed today, no change  ?Family History:  ?Family History  ?Problem Relation Age of  Onset  ? Anxiety disorder Mother   ? Depression Mother   ? Diabetes Mother   ? Hypertension Mother   ? Hyperlipidemia Mother   ? Asthma Father   ? Migraines Neg Hx   ? Seizures Neg Hx   ? Autism Neg Hx   ? ADD / ADHD Neg Hx   ? Bipolar disorder Neg Hx   ? Schizophrenia Neg Hx   ? ? ?Social History:  ?Social History  ? ?Socioeconomic History  ? Marital status: Single  ?  Spouse name: Not on file  ? Number of children: Not on file  ? Years of education: Not on file  ? Highest education level: 10th grade  ?Occupational History  ? Not on file   ?Tobacco Use  ? Smoking status: Never  ? Smokeless tobacco: Never  ?Vaping Use  ? Vaping Use: Never used  ?Substance and Sexual Activity  ? Alcohol use: Never  ? Drug use: Never  ? Sexual activity: Never  ?Other Topics Concern  ? Not on file  ?Social History Narrative  ? Lives with mom, dad and brother. She is in the 12th grade at [employer]. She enjoys eating, sleeping, and drawing.  ? ?Social Determinants of Health  ? ?Financial Resource Strain: Not on file  ?Food Insecurity: Not on file  ?Transportation Needs: Not on file  ?Physical Activity: Not on file  ?Stress: Not on file  ?Social Connections: Not on file  ? ? ?Allergies:  ?Allergies  ?Allergen Reactions  ? Cat Hair Extract   ? Dog Epithelium Allergy Skin Test   ? Gramineae Pollens   ? Horse Epithelium   ? ? ?Metabolic Disorder Labs: ?Lab Results  ?Component Value Date  ? HGBA1C 5.7 08/19/2011  ? ?No results found for: PROLACTIN ?Lab Results  ?Component Value Date  ? CHOL 109 08/19/2011  ? TRIG 48 08/19/2011  ? HDL 35 (L) 08/19/2011  ? VLDL 10 08/19/2011  ? [place] 64 08/19/2011  ? ?Lab Results  ?Component Value Date  ? TSH 1.065 12/29/2017  ? ? ?Therapeutic Level Labs: ?No results found for: LITHIUM ?No results found for: VALPROATE ?No components found for:  CBMZ ? ?Current Medications: ?Current Outpatient Medications  ?Medication Sig Dispense Refill  ? Albuterol (VENTOLIN IN) Inhale 2 puffs into the lungs every 4 (four) hours as needed (shortness of breath).     ? Cholecalciferol (VITAMIN D3) 50 MCG (2000 UT) capsule Take 2,000 Units by mouth 2 (two) times daily.    ? FLUoxetine (PROZAC) 20 MG tablet Take 1 tablet (20 mg total) by mouth daily. To be taken with Prozac 40 mg daily. 90 tablet 0  ? FLUoxetine (PROZAC) 40 MG capsule Take 1 capsule (40 mg total) by mouth daily. 90 capsule 0  ? fluticasone (FLONASE) 50 MCG/ACT nasal spray Place 2 sprays into both nostrils daily as needed for allergies or rhinitis.  3  ? methylphenidate (CONCERTA) 54 MG PO  CR tablet Take 1 tablet (54 mg total) by mouth every morning. 30 tablet 0  ? montelukast (SINGULAIR) 10 MG tablet Take 10 mg by mouth at bedtime.    ? QVAR REDIHALER 80 MCG/ACT inhaler Inhale 2 puffs into the lungs 2 (two) times daily.    ? ?No current facility-administered medications for this visit.  ? ? ? ?Musculoskeletal: ?Strength & Muscle Tone: WNL ?Gait & Station: Normal ?Patient leans: N/A ? ?Psychiatric Specialty Exam: ?Today's Vitals  ? 09/27/21 0800 09/27/21 0802  ?BP: (!) 141/89   ?Pulse:  80   ?Temp: 98.8 ?F (37.1 ?C)   ?TempSrc: Oral   ?Weight: 245 lb 12.8 oz (111.5 kg)   ?PainSc:  0-No pain  ? ? ? ?Body mass index is 49.65 kg/m?. ? ?Mental Status Exam: ?Appearance: casually dressed; obese; no overt signs of trauma or distress noted ?Attitude: calm, cooperative with fair eye contact ?Activity: No PMA/PMR, no tics/no tremors; no EPS noted  ?Speech: normal rate, rhythm and volume ?Thought Process: Logical, linear, and goal-directed.  ?Associations: no looseness, tangentiality, circumstantiality, flight of ideas, thought blocking or word salad noted ?Thought Content: (abnormal/psychotic thoughts): no abnormal or delusional thought process evidenced ?SI/HI: denies Si/Hi ?Perception: no illusions or visual/auditory hallucinations noted; no response to internal stimuli demonstrated ?Mood & Affect: "pretty good"/restricted ?Judgment & Insight: both fair ?Attention and Concentration : Good ?Cognition : WNL ?Language : Good ?ADL - Intact  ? ? ? ?Screenings: ?PHQ2-9   ? ?[place] Office Visit from 09/27/2021 in [facility] Office Visit from 08/16/2021 in [facility] from 07/01/2021 in [facility]  ?PHQ-2 Total Score 3 2 0  ?PHQ-9 Total Score 12 12 --  ? ?  ? ?[place] Office Visit from 09/27/2021 in [facility]  ?C-SSRS RISK CATEGORY No Risk  ? ?  ? ? ? ?Assessment and Plan:  ? ?This is a  21 year old African-American female with psychiatric history significant for major depressive disorder, anxiety, ADHD, ASD.  She appears to have improvement with medication adherence, but continues to struggle

## 2021-10-14 ENCOUNTER — Ambulatory Visit (INDEPENDENT_AMBULATORY_CARE_PROVIDER_SITE_OTHER): Payer: BC Managed Care – PPO | Admitting: Licensed Clinical Social Worker

## 2021-10-14 DIAGNOSIS — F418 Other specified anxiety disorders: Secondary | ICD-10-CM | POA: Diagnosis not present

## 2021-10-14 NOTE — Progress Notes (Signed)
Virtual Visit via Video Note  I connected with Anne Ball on 10/14/21 at  1:00 PM EDT by a video enabled telemedicine application and verified that I am speaking with the correct person using two identifiers.  Location: Patient: Home Provider: Office   I discussed the limitations of evaluation and management by telemedicine and the availability of in person appointments. The patient expressed understanding and agreed to proceed.   THERAPIST PROGRESS NOTE  Session Time: 1:00 pm-1:25 pm  Type of Therapy: Individual Therapy  Session #28  Purpose of Session/Treatment Goals addressed:  "Anne Ball will manage mood as evidenced by opening up, be more present, improve motivation, interact with her family, and have realistic expectations for herself"  Interventions: Therapist utilized CBT and Solution focused brief therapy to address focus and anxiety. Therapist provided support and empathy to patient during session. Therapist explored patient's anxiety related to school work, and taking care of herself.   Effectiveness: Patient was oriented x4 (person, place, situation, and time). Patient was alert, engaged, pleasant, and cooperative. Patient was casually dressed, and appropriately groomed. Patient has been working on her school work. She has not finished yet. A new semester has started and she gets to continue the whole semester to finish her school work. Patient has not been taking care of her physical health as much as she should. Patient has been trying to get back into doing art as well.   Patient engaged in session. She responded well to interventions. Patient continues to meet criteria for Anxiety, NOS and ADHD, inattentive. Patient will continue in outpatient therapy due to being the least restrictive service to meet her needs. Patient made moderate progress on her goals at this time.   Suicidal/Homicidal: Negativewithout intent/plan  Plan: Return again in 3-4 weeks. Patient will  continue to focus on time management, work on school, and take care of herself physicall.     Diagnosis: Axis I: ADHD, inattentive type and Anxiety Disorder NOS    Axis II: No diagnosis    I discussed the assessment and treatment plan with the patient. The patient was provided an opportunity to ask questions and all were answered. The patient agreed with the plan and demonstrated an understanding of the instructions.   The patient was advised to call back or seek an in-person evaluation if the symptoms worsen or if the condition fails to improve as anticipated.  I provided 30 minutes of non-face-to-face time during this encounter.  Bynum Bellows, LCSW 10/14/2021

## 2021-10-28 ENCOUNTER — Encounter: Payer: BC Managed Care – PPO | Attending: Pediatrics | Admitting: Dietician

## 2021-10-28 ENCOUNTER — Encounter: Payer: Self-pay | Admitting: Dietician

## 2021-10-28 VITALS — Ht 59.0 in | Wt 243.3 lb

## 2021-10-28 DIAGNOSIS — Z6841 Body Mass Index (BMI) 40.0 and over, adult: Secondary | ICD-10-CM | POA: Diagnosis not present

## 2021-10-28 DIAGNOSIS — R030 Elevated blood-pressure reading, without diagnosis of hypertension: Secondary | ICD-10-CM | POA: Diagnosis not present

## 2021-10-28 DIAGNOSIS — F840 Autistic disorder: Secondary | ICD-10-CM

## 2021-10-28 NOTE — Patient Instructions (Signed)
Great job getting started with some exercise, keep working on this! Continue to limit added fats like Ranch and mayo, use small amounts. Can also consider trying "light" versions.  Increase veggies and fruits. Plan to have at least one veg or fruit with each meal and with some snacks. These foods are low in calories and help lower blood pressure.

## 2021-10-28 NOTE — Progress Notes (Signed)
Medical Nutrition Therapy: Visit start time: 0850  end time: 0920  Assessment:  Diagnosis: elevated BP, obesity Medical history changes: no changes Psychosocial issues/ stress concerns: autism, ADHD, depression  Current weight: 243.3   Height: 4'11" BMI: 49.14  Medications, supplement changes: reconciled list in medical record  Progress and evaluation:  Weight has decreased by about 14lbs since previous visit 07/01/21. Patient reports mom is keeping healthier food options in the home, patient reports eating slightly smaller portions. Both parents work to make healthy food choices themselves and for the family. Some increase in veg, fruit ie celery, pineapple. Trying to control the amount of Ranch dressing used Recent HbA1C of 6.1%, she has begun taking metformin which she feels has also helped with some weight loss.   Physical activity: starting calisthenic type exercise, recumbent bike in past several days. Voices interest in pickleball  Dietary Intake:  Usual eating pattern includes 2 meals and 2 snacks per day. Dining out frequency:   Breakfast: 10-12p --small amount of food ie hash browns, sometimes with celery to take metformin Snack: none Lunch: none (one meal for breakfast and lunch together)  Snack: granola bar; banana; pineapple Supper: frozen foods; mom cooks brown rice/ whole wheat pasta trying to get used to red sauce, does not like broccoli;  Snack: ham or occ [place] sandwich on white (whole wheat is avail in the home) with mayo Beverages: water 64oz daily; rarely soda esp when out to eat, (has not been buying soda lately)  Intervention:   Nutrition Care Education:  Basic nutrition: reviewed appropriate nutrient balance; appropriate meal and snack schedule; general nutrition guidelines    Weight control: reviewed progress since previous visit; portion control; limiting added fats Other: Commended patient for progress made. Updated goals to continue increasing healthy  food choices as physical activity Patient declined additional MNT follow up; offered option of scheduling later as needed.   Nutritional Diagnosis:  [place]-2.1 Inpaired nutrition utilization As related to elevated BP.  As evidenced by previous BP readings. [place]-3.3 Overweight/obesity As related to excess calories, inadequate physical activity.  As evidenced by patient with current BMI of 49, working on diet and lifestyle changes to promote ongoing weight loss and improve health risk.   Education Materials given:  Visit summary with goals/ instructions   Learner/ who was taught:  Patient  Family member: mother Dondrea Caviness   Level of understanding: Verbalizes/ demonstrates competency   Demonstrated degree of understanding via:   Teach back Learning barriers: Autism spectrum  Willingness to learn/ readiness for change: Eager, change in progress   Monitoring and Evaluation:  Dietary intake, exercise, BP control, and body weight      follow up: prn

## 2021-11-01 ENCOUNTER — Ambulatory Visit (INDEPENDENT_AMBULATORY_CARE_PROVIDER_SITE_OTHER): Payer: BC Managed Care – PPO | Admitting: Licensed Clinical Social Worker

## 2021-11-01 DIAGNOSIS — F418 Other specified anxiety disorders: Secondary | ICD-10-CM | POA: Diagnosis not present

## 2021-11-01 DIAGNOSIS — F900 Attention-deficit hyperactivity disorder, predominantly inattentive type: Secondary | ICD-10-CM

## 2021-11-01 NOTE — Progress Notes (Signed)
Virtual Visit via Video Note  I connected with Anne Ball on 11/01/21 at  1:00 PM EDT by a video enabled telemedicine application and verified that I am speaking with the correct person using two identifiers.  Location: Patient: Home Provider: Office   I discussed the limitations of evaluation and management by telemedicine and the availability of in person appointments. The patient expressed understanding and agreed to proceed.   THERAPIST PROGRESS NOTE  Session Time: 1:00 pm-1:20 pm  Type of Therapy: Individual Therapy  Session #29  Purpose of Session/Treatment Goals addressed:  "Tailer will manage mood as evidenced by opening up, be more present, improve motivation, interact with her family, and have realistic expectations for herself"  Interventions: Therapist utilized CBT and Solution focused brief therapy to address focus and anxiety. Therapist provided support and empathy to patient during session. Therapist worked with patient to identify areas to focus on that would improve anxieties and focus.   Effectiveness: Patient was oriented x4 (person, place, situation, and time). Patient was casually dressed, and appropriately groomed. Patient was alert, engaged, pleasant, and cooperative. Patient noted that things have been the same and that is a good thing. Patient has been focusing on school. She has been working on completing her school work daily. Patient has also been focusing on her physical health. She has been working out for a few hours when she works out. Patient wants to be more consistent with working out. She is spending time with her family as well.   Patient engaged in session. She responded well to interventions. Patient continues to meet criteria for Anxiety, NOS and ADHD, inattentive. Patient will continue in outpatient therapy due to being the least restrictive service to meet her needs. Patient made moderate progress on her goals at this time.    Suicidal/Homicidal: Negativewithout intent/plan  Plan: Return again in 3-4 weeks. Patient will continue to focus on school, and be consistent with working out.    Diagnosis: Axis I: ADHD, inattentive type and Anxiety Disorder NOS    Axis II: No diagnosis    I discussed the assessment and treatment plan with the patient. The patient was provided an opportunity to ask questions and all were answered. The patient agreed with the plan and demonstrated an understanding of the instructions.   The patient was advised to call back or seek an in-person evaluation if the symptoms worsen or if the condition fails to improve as anticipated.  I provided 20 minutes of non-face-to-face time during this encounter.  Bynum Bellows, LCSW 11/01/2021

## 2021-11-08 ENCOUNTER — Ambulatory Visit (INDEPENDENT_AMBULATORY_CARE_PROVIDER_SITE_OTHER): Payer: BC Managed Care – PPO | Admitting: Child and Adolescent Psychiatry

## 2021-11-08 ENCOUNTER — Encounter: Payer: Self-pay | Admitting: Child and Adolescent Psychiatry

## 2021-11-08 VITALS — BP 138/83 | HR 101 | Temp 97.2°F | Wt 242.8 lb

## 2021-11-08 DIAGNOSIS — F840 Autistic disorder: Secondary | ICD-10-CM

## 2021-11-08 DIAGNOSIS — F3341 Major depressive disorder, recurrent, in partial remission: Secondary | ICD-10-CM | POA: Diagnosis not present

## 2021-11-08 DIAGNOSIS — F900 Attention-deficit hyperactivity disorder, predominantly inattentive type: Secondary | ICD-10-CM | POA: Diagnosis not present

## 2021-11-08 DIAGNOSIS — F418 Other specified anxiety disorders: Secondary | ICD-10-CM

## 2021-11-08 MED ORDER — METHYLPHENIDATE HCL ER (OSM) 54 MG PO TBCR: 54.0000 mg | EXTENDED_RELEASE_TABLET | ORAL | 0 refills | Status: DC

## 2021-11-08 MED ORDER — FLUOXETINE HCL 20 MG PO TABS
60.0000 mg | ORAL_TABLET | Freq: Every day | ORAL | 1 refills | Status: DC
Start: 2021-11-08 — End: 2022-01-03

## 2021-11-08 NOTE — Progress Notes (Signed)
BH MD/PA/NP OP Progress Note  11/08/21 9:00 AM Anne Ball  MRN:  [id number]  Chief Complaint:  Chief Complaint   Follow-up    Medication management follow-up for anxiety, depression, ADHD.  HPI: This is a 21 year old African-American female with psychiatric history significant for major depressive disorder, anxiety, ADHD, ASD was seen and evaluated in office for medication management follow-up.  Anne Ball was accompanied with her mother for her follow-up appointment.  She was seen and evaluated alone and jointly with her mother and with her written and verbal consent.  Anne Ball denies any new concerns for today's appointment.  She reports that she is doing "better".  She reports that her sleep is more regular, goes to bed around 11:00 PM wakes up around 8 AM, after waking up she has been working out to strengthen her legs.  She also reports that she is doing her schoolwork, doing some other chores around the house.  She reports that her mood is occasionally sad, and her motivation and energy is better.  She denies excessive worries or anxiety, denies SI or HI.  She reports that she got an extension from her [employer] teacher to get her school work done this summer so that she can graduate by fall.  She reports that she has been compliant with her medications and denies any problems with them.  She reports that things are going well at home with her parents.  She reports that she recently celebrated Father's Day and enjoyed her time with her family.  Her mother reports that patient is doing better overall.  She reports that Anne Ball is coming out of her room more, eating outside, focusing on eating better and therefore has reduced 10 to 12 pounds over the last few months.  She denies any concerns for today's appointment.  We discussed to ensure compliance to the medication, she continues to see her therapist every 2 to 3 weeks.  Her mother has reached out to [facility] and was able to get her into a  program starting next fall at [facility], it allows the patient to take 1 college credit, receive individual and group counseling as well as assistance with vocational rehabilitation.  They will follow back again in 2 months or earlier if needed.  Visit Diagnosis:    ICD-10-CM   1. Recurrent major depressive disorder, in partial remission (HCC)  F33.41     2. Attention deficit hyperactivity disorder (ADHD), predominantly inattentive type  F90.0 methylphenidate (CONCERTA) 54 MG PO CR tablet    methylphenidate (CONCERTA) 54 MG PO CR tablet    3. Autism spectrum disorder  F84.0     4. Other specified anxiety disorders  F41.8         Past Psychiatric History: As mentioned in initial H&P, reviewed today, no change. She continues to take Prozac 40 mg once a day, Concerta 54 mg once a day and follow-up with individual therapist, started following Mr. Bynum Bellows. Past Medical History:  Past Medical History:  Diagnosis Date   Anxiety    Asthma    Depression     Past Surgical History:  Procedure Laterality Date   NO PAST SURGERIES      Family Psychiatric History: As mentioned in initial H&P, reviewed today, no change  Family History:  Family History  Problem Relation Age of Onset   Anxiety disorder Mother    Depression Mother    Diabetes Mother    Hypertension Mother    Hyperlipidemia Mother    Asthma  Father    Migraines Neg Hx    Seizures Neg Hx    Autism Neg Hx    ADD / ADHD Neg Hx    Bipolar disorder Neg Hx    Schizophrenia Neg Hx     Social History:  Social History   Socioeconomic History   Marital status: Single    Spouse name: Not on file   Number of children: Not on file   Years of education: Not on file   Highest education level: 10th grade  Occupational History   Not on file  Tobacco Use   Smoking status: Never   Smokeless tobacco: Never  Vaping Use   Vaping Use: Never used  Substance and Sexual Activity   Alcohol use: Never   Drug use: Never   Sexual  activity: Never  Other Topics Concern   Not on file  Social History Narrative   Lives with mom, dad and brother. She is in the 12th grade at [employer]. She enjoys eating, sleeping, and drawing.   Social Determinants of Health   Financial Resource Strain: Low Risk  (09/12/2018)   Overall Financial Resource Strain (CARDIA)    Difficulty of Paying Living Expenses: Not hard at all  Food Insecurity: No Food Insecurity (09/12/2018)   Hunger Vital Sign    Worried About Running Out of Food in the Last Year: Never true    Ran Out of Food in the Last Year: Never true  Transportation Needs: No Transportation Needs (09/12/2018)   PRAPARE - Administrator, Civil Service (Medical): No    Lack of Transportation (Non-Medical): No  Physical Activity: Inactive (09/12/2018)   Exercise Vital Sign    Days of Exercise per Week: 0 days    Minutes of Exercise per Session: 0 min  Stress: Stress Concern Present (09/12/2018)   [employer] of Occupational Health - Occupational Stress Questionnaire    Feeling of Stress : Rather much  Social Connections: Unknown (09/12/2018)   Social Connection and Isolation Panel [NHANES]    Frequency of Communication with Friends and Family: Not on file    Frequency of Social Gatherings with Friends and Family: Not on file    Attends Religious Services: 1 to 4 times per year    Active Member of [employer] or Organizations: No    Attends Banker Meetings: Never    Marital Status: Never married    Allergies:  Allergies  Allergen Reactions   Cat Hair Extract    Dog Epithelium Allergy Skin Test    Gramineae Pollens    Horse Epithelium     Metabolic Disorder Labs: Lab Results  Component Value Date   HGBA1C 5.7 08/19/2011   No results found for: "PROLACTIN" Lab Results  Component Value Date   CHOL 109 08/19/2011   TRIG 48 08/19/2011   HDL 35 (L) 08/19/2011   VLDL 10 08/19/2011   LDLCALC 64 08/19/2011   Lab Results  Component Value  Date   TSH 1.065 12/29/2017    Therapeutic Level Labs: No results found for: "LITHIUM" No results found for: "VALPROATE" No results found for: "CBMZ"  Current Medications: Current Outpatient Medications  Medication Sig Dispense Refill   Albuterol (VENTOLIN IN) Inhale 2 puffs into the lungs every 4 (four) hours as needed (shortness of breath).      Cholecalciferol (VITAMIN D3) 50 MCG (2000 UT) capsule Take 2,000 Units by mouth 2 (two) times daily.     fluticasone (FLONASE) 50 MCG/ACT nasal spray Place  2 sprays into both nostrils daily as needed for allergies or rhinitis.  3   metFORMIN (GLUCOPHAGE-XR) 500 MG 24 hr tablet Take 1,000 mg by mouth 2 (two) times daily.     methylphenidate (CONCERTA) 54 MG PO CR tablet Take 1 tablet (54 mg total) by mouth every morning. 30 tablet 0   montelukast (SINGULAIR) 10 MG tablet Take 10 mg by mouth at bedtime.     QVAR REDIHALER 80 MCG/ACT inhaler Inhale 2 puffs into the lungs 2 (two) times daily.     FLUoxetine (PROZAC) 20 MG tablet Take 3 tablets (60 mg total) by mouth daily. To be taken with Prozac 40 mg daily. 90 tablet 1   methylphenidate (CONCERTA) 54 MG PO CR tablet Take 1 tablet (54 mg total) by mouth every morning. 30 tablet 0   No current facility-administered medications for this visit.     Musculoskeletal: Strength & Muscle Tone: WNL Gait & Station: Normal Patient leans: N/A  Psychiatric Specialty Exam: Today's Vitals   11/08/21 0838  BP: 138/83  Pulse: (!) 101  Temp: (!) 97.2 F (36.2 C)  TempSrc: Temporal  Weight: 242 lb 12.8 oz (110.1 kg)     Body mass index is 49.04 kg/m.  Mental Status Exam: Appearance: casually dressed; well groomed; no overt signs of trauma or distress noted Attitude: calm, cooperative with good eye contact Activity: No PMA/PMR, no tics/no tremors; no EPS noted  Speech: normal rate, rhythm and volume Thought Process: Logical, linear, and goal-directed.  Associations: no looseness, tangentiality,  circumstantiality, flight of ideas, thought blocking or word salad noted Thought Content: (abnormal/psychotic thoughts): no abnormal or delusional thought process evidenced SI/HI: denies Si/Hi Perception: no illusions or visual/auditory hallucinations noted; no response to internal stimuli demonstrated Mood & Affect: "good"/full range, neutral Judgment & Insight: both fair Attention and Concentration : Good Cognition : WNL Language : Good ADL - Intact     Screenings: PHQ2-9    Flowsheet Row Office Visit from 09/27/2021 in [facility] Office Visit from 08/16/2021 in [facility] Nutrition from 07/01/2021 in [facility] [place]  PHQ-2 Total Score 3 2 0  PHQ-9 Total Score 12 12 --      Flowsheet Row Office Visit from 09/27/2021 in [facility]  C-SSRS RISK CATEGORY No Risk        Assessment and Plan:   This is a 21 year old African-American female with psychiatric history significant for major depressive disorder, anxiety, ADHD, ASD.  She appears to have improvement with medication adherence, and also improvement with motivation to do daily chores/exercise/healthy eating. Doing school through [employer] to graduate HS, however seems to procastinate and had to extend the program this summer. She is recommended to continue with current medication and continue to see therapist every 2 weeks. Mother was able to get her in [facility] offered program at [facility] but starting next spring. They will follow back with me again in 8 weeks currently if needed.       Plan as below.   #1 Depression (recurrent, mild) - Continue Prozac 60 mg daily and improve compliance  - Continue ind therapy with Mr Pollyann Savoy  - She has supportive parents which is a good prognostic and protective factor.     #2 Anxiety (stable) - Her presentation appears most consistent with generalized anxiety and social anxiety disorders. ' -  Recommending medication and therapy as mentioned above.    #3 ADHD (chronic and unstable) -  Continue with Concerta  54 mg once a day   #4 Autism (Chronic, stable) - Testing done at [facility], and diagnosed with ASD per report.  -  Mother is trying to find resources for education and vocational rehab through [facility].    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 Mother however reports that they have continued with diamox and planning to find a different neuro.  - Defer management to neuro.  - Following with endo and nutrition service due to obesity and prediabetes.  - On metformin now.    30 minutes total time for encounter today which included chart review, pt evaluation, collaterals, medication and other treatment discussions, family counseling, medication orders and charting.            [name], MD       [name], MD 11/08/21, 10:30 am

## 2021-11-29 ENCOUNTER — Ambulatory Visit (INDEPENDENT_AMBULATORY_CARE_PROVIDER_SITE_OTHER): Payer: BC Managed Care – PPO | Admitting: Licensed Clinical Social Worker

## 2021-11-29 DIAGNOSIS — F418 Other specified anxiety disorders: Secondary | ICD-10-CM | POA: Diagnosis not present

## 2021-11-29 NOTE — Progress Notes (Signed)
Virtual Visit via Video Note  I connected with Anne Ball on 11/29/21 at 11:00 AM EDT by a video enabled telemedicine application and verified that I am speaking with the correct person using two identifiers.  Location: Patient: Home Provider: Office   I discussed the limitations of evaluation and management by telemedicine and the availability of in person appointments. The patient expressed understanding and agreed to proceed.   THERAPIST PROGRESS NOTE  Session Time: 11:00 am-11:20 am  Type of Therapy: Individual Therapy  Session #30  Purpose of Session/Treatment Goals addressed:  "Evia will manage mood as evidenced by opening up, be more present, improve motivation, interact with her family, and have realistic expectations for herself"  Interventions: Therapist utilized CBT and Solution focused brief therapy to address focus and anxiety. Therapist provided support and empathy to patient during session. Therapist worked with patient to improve her time management to complete her course work and reduce her anxiety.   Effectiveness: Patient was oriented x4 (person, place, situation, and time). Patient was casually dressed, and appropriately groomed. Patient was alert, engaged, pleasant, and cooperative. Patient noted that not much has changed since the last session. She is continuing to work on her school work daily. She doesn't look at how much she has left because it stresses her out. Patient has been doing Emergency planning/management officer and traditional art. She is spending time with her family and enjoying herself. Patient feels like she is not optimizing her time. Patient was told by her school that she would need to do 10 hours a week to finish it by summer's end but this was before she got an extension. Patient is doing about an hour a day. Patient understood that if she did just 15 minutes of extra work a day that would equal to over an hour of work at the end of the week. This would help  her complete her overall course work faster.   Patient engaged in session. She responded well to interventions. Patient continues to meet criteria for Anxiety, NOS and ADHD, inattentive. Patient will continue in outpatient therapy due to being the least restrictive service to meet her needs. Patient made moderate progress on her goals at this time.   Suicidal/Homicidal: Negativewithout intent/plan  Plan: Return again in 3-4 weeks. Patient will focus on putting an additional 15 minutes into her daily work to get an additional hour of school work completed a week.   Diagnosis: Axis I: ADHD, inattentive type and Anxiety Disorder NOS    Axis II: No diagnosis    I discussed the assessment and treatment plan with the patient. The patient was provided an opportunity to ask questions and all were answered. The patient agreed with the plan and demonstrated an understanding of the instructions.   The patient was advised to call back or seek an in-person evaluation if the symptoms worsen or if the condition fails to improve as anticipated.  I provided 25 minutes of non-face-to-face time during this encounter.  Bynum Bellows, LCSW 11/29/2021

## 2022-01-03 ENCOUNTER — Ambulatory Visit: Payer: BC Managed Care – PPO | Admitting: Child and Adolescent Psychiatry

## 2022-01-03 ENCOUNTER — Encounter: Payer: Self-pay | Admitting: Child and Adolescent Psychiatry

## 2022-01-03 VITALS — BP 147/89 | HR 83 | Temp 97.6°F | Wt 247.6 lb

## 2022-01-03 DIAGNOSIS — F840 Autistic disorder: Secondary | ICD-10-CM

## 2022-01-03 DIAGNOSIS — F900 Attention-deficit hyperactivity disorder, predominantly inattentive type: Secondary | ICD-10-CM | POA: Diagnosis not present

## 2022-01-03 DIAGNOSIS — F331 Major depressive disorder, recurrent, moderate: Secondary | ICD-10-CM | POA: Diagnosis not present

## 2022-01-03 DIAGNOSIS — F418 Other specified anxiety disorders: Secondary | ICD-10-CM | POA: Diagnosis not present

## 2022-01-03 MED ORDER — METHYLPHENIDATE HCL ER (OSM) 54 MG PO TBCR: 54.0000 mg | EXTENDED_RELEASE_TABLET | ORAL | 0 refills | Status: DC

## 2022-01-03 MED ORDER — FLUOXETINE HCL 20 MG PO TABS: 60.0000 mg | ORAL_TABLET | Freq: Every day | ORAL | 1 refills | Status: DC

## 2022-01-03 NOTE — Progress Notes (Signed)
BH MD/PA/NP OP Progress Note  01/03/22 9:00 AM Anne Ball  MRN:  [id number]  Chief Complaint:  Chief Complaint   Follow-up    Medication management follow-up for anxiety, depression, ADHD.  HPI: This is a 21 year old African-American female with psychiatric history significant for major depressive disorder, anxiety, ADHD, ASD was seen and evaluated in office for medication management follow-up.  Anne Ball was accompanied with her mother for her follow-up appointment.  She was seen and evaluated alone and jointly with her mother and with her written and verbal consent.  Appointment was attended by rotating medical students and pt and parent provided informed consent to allow med student attend the appointment.   Anne Ball denies any new concerns for today's appointment, reports that her mood has been "meh.." and then describes it as neutral. She reports that she could not finish the school, now plans to take the next semester during this fall to complete one class to graduate HS. We discussed the barriers to complete the class, she reports that she works for sometime in the morning, then in the afternoon.  She also reports anhedonia on most days.  Sleep is also out of routine, struggles with overeating, and has feelings of worthlessness.  She denies any SI or HI.  She also reports that recently she has been anxious a lot more than usual, and has difficulties relaxing.  She reports that she is not taking her medications consistently, misses about 3 to 4 days a week.  She continues to report that she forgets to take the medication despite her mother organizes them in a pill organizer.  We discussed different strategies to improve the medication adherence.  Her mother reports that her main concern for her is that she continues to struggle with activities of daily life, and very codependent on her.  Infrequently she will have outbursts, yesterday she was telling her that she is not planning to  continue to take the medications because it is not helping her.  Mother reports that she has reached out to vocational rehabilitation office and they have an intake scheduled.  Mother also expresses concerns regarding patient's inability to pass class to complete the school.  We discussed options for in person school which is now possible as both the parents work.  Discussed with mother that if patient has an outlet where she can go out in function and she may feel better about herself.  I also discussed with the patient that since she does not take her medications consistently it is hard to determine whether these medicines are working or difficult for this Clinical research associate to make any changes.  She agrees to be more adherent to the medications.  Because of lack of adherence to medication we discussed to continue with current medications and follow back in about 6 weeks early if needed.  She continues to see her therapist about every 2 to 4 weeks.  Visit Diagnosis:    ICD-10-CM   1. Moderate episode of recurrent major depressive disorder (HCC)  F33.1     2. Attention deficit hyperactivity disorder (ADHD), predominantly inattentive type  F90.0     3. Autism spectrum disorder  F84.0     4. Other specified anxiety disorders  F41.8          Past Psychiatric History: As mentioned in initial H&P, reviewed today, no change. She continues to take Prozac 40 mg once a day, Concerta 54 mg once a day and follow-up with individual therapist, started following Mr. Bynum Bellows.  Past Medical History:  Past Medical History:  Diagnosis Date   Anxiety    Asthma    Depression     Past Surgical History:  Procedure Laterality Date   NO PAST SURGERIES      Family Psychiatric History: As mentioned in initial H&P, reviewed today, no change  Family History:  Family History  Problem Relation Age of Onset   Anxiety disorder Mother    Depression Mother    Diabetes Mother    Hypertension Mother    Hyperlipidemia  Mother    Asthma Father    Migraines Neg Hx    Seizures Neg Hx    Autism Neg Hx    ADD / ADHD Neg Hx    Bipolar disorder Neg Hx    Schizophrenia Neg Hx     Social History:  Social History   Socioeconomic History   Marital status: Single    Spouse name: Not on file   Number of children: Not on file   Years of education: Not on file   Highest education level: 10th grade  Occupational History   Not on file  Tobacco Use   Smoking status: Never   Smokeless tobacco: Never  Vaping Use   Vaping Use: Never used  Substance and Sexual Activity   Alcohol use: Never   Drug use: Never   Sexual activity: Never  Other Topics Concern   Not on file  Social History Narrative   Lives with mom, dad and brother. She is in the 12th grade at [employer]. She enjoys eating, sleeping, and drawing.   Social Determinants of Health   Financial Resource Strain: Low Risk  (09/12/2018)   Overall Financial Resource Strain (CARDIA)    Difficulty of Paying Living Expenses: Not hard at all  Food Insecurity: No Food Insecurity (09/12/2018)   Hunger Vital Sign    Worried About Running Out of Food in the Last Year: Never true    Ran Out of Food in the Last Year: Never true  Transportation Needs: No Transportation Needs (09/12/2018)   PRAPARE - Hydrologist (Medical): No    Lack of Transportation (Non-Medical): No  Physical Activity: Inactive (09/12/2018)   Exercise Vital Sign    Days of Exercise per Week: 0 days    Minutes of Exercise per Session: 0 min  Stress: Stress Concern Present (09/12/2018)   Susan Moore    Feeling of Stress : Rather much  Social Connections: Unknown (09/12/2018)   Social Connection and Isolation Panel [NHANES]    Frequency of Communication with Friends and Family: Not on file    Frequency of Social Gatherings with Friends and Family: Not on file    Attends Religious Services: 1 to  4 times per year    Active Member of [employer] or Organizations: No    Attends Archivist Meetings: Never    Marital Status: Never married    Allergies:  Allergies  Allergen Reactions   Cat Hair Extract    Dog Epithelium Allergy Skin Test    Gramineae Pollens    Horse Epithelium     Metabolic Disorder Labs: Lab Results  Component Value Date   HGBA1C 5.7 08/19/2011   No results found for: "PROLACTIN" Lab Results  Component Value Date   CHOL 109 08/19/2011   TRIG 48 08/19/2011   HDL 35 (L) 08/19/2011   VLDL 10 08/19/2011   LDLCALC 64 08/19/2011   Lab  Results  Component Value Date   TSH 1.065 12/29/2017    Therapeutic Level Labs: No results found for: "LITHIUM" No results found for: "VALPROATE" No results found for: "CBMZ"  Current Medications: Current Outpatient Medications  Medication Sig Dispense Refill   Albuterol (VENTOLIN IN) Inhale 2 puffs into the lungs every 4 (four) hours as needed (shortness of breath).      Cholecalciferol (VITAMIN D3) 50 MCG (2000 UT) capsule Take 2,000 Units by mouth 2 (two) times daily.     FLUoxetine (PROZAC) 20 MG tablet Take 3 tablets (60 mg total) by mouth daily. To be taken with Prozac 40 mg daily. 90 tablet 1   fluticasone (FLONASE) 50 MCG/ACT nasal spray Place 2 sprays into both nostrils daily as needed for allergies or rhinitis.  3   metFORMIN (GLUCOPHAGE-XR) 500 MG 24 hr tablet Take 1,000 mg by mouth 2 (two) times daily.     methylphenidate (CONCERTA) 54 MG PO CR tablet Take 1 tablet (54 mg total) by mouth every morning. 30 tablet 0   methylphenidate (CONCERTA) 54 MG PO CR tablet Take 1 tablet (54 mg total) by mouth every morning. 30 tablet 0   montelukast (SINGULAIR) 10 MG tablet Take 10 mg by mouth at bedtime.     QVAR REDIHALER 80 MCG/ACT inhaler Inhale 2 puffs into the lungs 2 (two) times daily.     No current facility-administered medications for this visit.     Musculoskeletal: Strength & Muscle Tone: WNL Gait  & Station: Normal Patient leans: N/A  Psychiatric Specialty Exam: Today's Vitals   01/03/22 0832  BP: (!) 147/89  Pulse: 83  Temp: 97.6 F (36.4 C)  TempSrc: Temporal  Weight: 247 lb 9.6 oz (112.3 kg)     Body mass index is 50.01 kg/m.  Mental Status Exam: Appearance: casually dressed; obese; fairly groomed; no overt signs of trauma or distress noted Attitude: calm, cooperative with fair eye contact Activity: No PMA/PMR, no tics/no tremors; no EPS noted  Speech: normal rate, rhythm and volume Thought Process: Logical, linear, and goal-directed.  Associations: no looseness, tangentiality, circumstantiality, flight of ideas, thought blocking or word salad noted Thought Content: (abnormal/psychotic thoughts): no abnormal or delusional thought process evidenced SI/HI: denies Si/Hi Perception: no illusions or visual/auditory hallucinations noted; no response to internal stimuli demonstrated Mood & Affect: "good"/full range, neutral Judgment: Fair & Insight: Poor  Attention and Concentration : Good Cognition : WNL Language : Good ADL - Intact     Screenings: GAD-7    Flowsheet Row Office Visit from 01/03/2022 in [facility]  Total GAD-7 Score 17      PHQ2-9    Flowsheet Row Office Visit from 01/03/2022 in [facility] Office Visit from 09[facility]27/2021 in [facility] Office Visit from 08/16/2021 in [facility] Nutrition from 07/01/2021 in [facility]  PHQ-2 Total Score 5 3 2  0  PHQ-9 Total Score 20 12 12  --      Flowsheet Row Office Visit from 01/03/2022 in [facility] Office Visit from 09/27/2021 in [facility]  C-SSRS RISK CATEGORY No Risk No Risk        Assessment and Plan:   This is a 21 year old African-American female with psychiatric history significant for major depressive  disorder, anxiety, ADHD, ASD.  She again not adherent to medications, and seems to be struggling with lack of motivation, depressive mood. Doing school through [employer] to graduate HS, however seems to procastinate  and had to extend the program this summer and now fall. She is recommended to continue with current medication and improve medication adhrence. She continue to see therapist every 2-4 weeks. Mother was able to get her in [facility] offered program at [facility] but starting next spring. Also has intake with [employer] office. They will follow back with me again in 6 weeks currently if needed.       Plan as below.   #1 Depression (recurrent, mild) - Continue Prozac 60 mg daily and improve compliance  - Continue ind therapy with Mr Pollyann Savoy  - She has supportive parents which is a good prognostic and protective factor.     #2 Anxiety (stable) - Her presentation appears most consistent with generalized anxiety and social anxiety disorders. - Recommending medication and therapy as mentioned above.    #3 ADHD (chronic and unstable) -  Continue with Concerta 54 mg once a day   #4 Autism (Chronic, stable) - Testing done at [facility], and diagnosed with ASD per report.  -  Mother is trying to find resources for education and vocational rehab through [facility] and [place].    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 Mother however reports that they have continued with diamox and planning to find a different neuro.  - Defer management to neuro.  - Following with endo and nutrition service due to obesity and prediabetes.  - On metformin now.    30 minutes total time for encounter today which included chart review, pt evaluation, collaterals, medication and other treatment discussions, family counseling, medication orders and  charting.            [name], MD       [name], MD 01/03/22, 10:30 am

## 2022-01-04 ENCOUNTER — Other Ambulatory Visit: Payer: Self-pay | Admitting: Child and Adolescent Psychiatry

## 2022-01-11 ENCOUNTER — Ambulatory Visit (INDEPENDENT_AMBULATORY_CARE_PROVIDER_SITE_OTHER): Payer: BC Managed Care – PPO | Admitting: Licensed Clinical Social Worker

## 2022-01-11 DIAGNOSIS — F418 Other specified anxiety disorders: Secondary | ICD-10-CM

## 2022-01-12 NOTE — Progress Notes (Signed)
Virtual Visit via Video Note  I connected with Anne Ball on 01/12/22 at 11:00 AM EDT by a video enabled telemedicine application and verified that I am speaking with the correct person using two identifiers.  Location: Patient: Home Provider: Office   I discussed the limitations of evaluation and management by telemedicine and the availability of in person appointments. The patient expressed understanding and agreed to proceed.   THERAPIST PROGRESS NOTE  Session Time: 11:00 am-11:25 am  Type of Therapy: Individual Therapy  Session #31  Purpose of Session/Treatment Goals addressed:  "Anne Ball will manage mood as evidenced by opening up, be more present, improve motivation, interact with her family, and have realistic expectations for herself"  Interventions: Therapist utilized CBT and Solution focused brief therapy to address focus and anxiety. Therapist provided support and empathy to patient during session. Therapist explored patient's resistance to completing her school work.   Effectiveness: Patient was oriented x4 (person, place, situation, and time). Patient was casually dressed, and appropriately groomed. Patient was alert, engaged, pleasant, and cooperative. Patient stated that things were the same since the last visit. 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 Patient understood that avoidance will only give her short term relief but increase her long term anxiety. Patient agreed to "face and feel" any anxiety related to her school work to stick with her time management.   Patient engaged in session. She responded well to interventions. Patient continues to meet criteria for Anxiety, NOS and ADHD, inattentive. Patient will continue in outpatient therapy due to being the least restrictive service to meet her needs. Patient made moderate progress on her goals at this time.   Suicidal/Homicidal: Negativewithout  intent/plan  Plan: Return again in 3-4 weeks. Patient will continue to work on time management and getting her school work completed.    Diagnosis: Axis I: ADHD, inattentive type and Anxiety Disorder NOS    Axis II: No diagnosis    I discussed the assessment and treatment plan with the patient. The patient was provided an opportunity to ask questions and all were answered. The patient agreed with the plan and demonstrated an understanding of the instructions.   The patient was advised to call back or seek an in-person evaluation if the symptoms worsen or if the condition fails to improve as anticipated.  I provided 25 minutes of non-face-to-face time during this encounter.  Bynum Bellows, LCSW 01/12/2022

## 2022-01-25 ENCOUNTER — Ambulatory Visit (INDEPENDENT_AMBULATORY_CARE_PROVIDER_SITE_OTHER): Payer: BC Managed Care – PPO | Admitting: Licensed Clinical Social Worker

## 2022-01-25 DIAGNOSIS — F900 Attention-deficit hyperactivity disorder, predominantly inattentive type: Secondary | ICD-10-CM

## 2022-01-25 DIAGNOSIS — F418 Other specified anxiety disorders: Secondary | ICD-10-CM | POA: Diagnosis not present

## 2022-01-25 NOTE — Progress Notes (Signed)
Virtual Visit via Video Note  I connected with Anne Ball on 01/25/22 at 11:00 AM EDT by a video enabled telemedicine application and verified that I am speaking with the correct person using two identifiers.  Location: Patient: Home Provider: Office   I discussed the limitations of evaluation and management by telemedicine and the availability of in person appointments. The patient expressed understanding and agreed to proceed.   THERAPIST PROGRESS NOTE  Session Time: 11:00 am-11:25 am  Type of Therapy: Individual Therapy  Session #32  Purpose of Session/Treatment Goals addressed:  "Lexxi will manage mood as evidenced by opening up, be more present, improve motivation, interact with her family, and have realistic expectations for herself"  Interventions: Therapist utilized CBT and Solution focused brief therapy to address focus and anxiety. Therapist provided support and empathy to patient during session. Therapist explored patient's school work and worked with patient on her self care.   Effectiveness: Patient was oriented x4 (person, place, situation, and time). Patient was casually dressed, and appropriately groomed. Patient was alert, engaged, pleasant, and cooperative. Patient noted tat much has changed. She is still working on her school work. Patient has been out of the home, and has interacted with her family. Patient noted that she is taking care of her health. Patient has not been engaged in her art work. She has not felt inspired lately. Patient asked for a topic to draw from therapist and was given a sea creature as a subject. She is going to take time to draw and do school work.   Patient engaged in session. She responded well to interventions. Patient continues to meet criteria for Anxiety, NOS and ADHD, inattentive. Patient will continue in outpatient therapy due to being the least restrictive service to meet her needs. Patient made moderate progress on her goals at  this time.   Suicidal/Homicidal: Negativewithout intent/plan  Plan: Return again in 4-6 weeks. Patient will continue to focus on her school work and take time for   Diagnosis: Axis I: ADHD, inattentive type and Anxiety Disorder NOS    Axis II: No diagnosis    I discussed the assessment and treatment plan with the patient. The patient was provided an opportunity to ask questions and all were answered. The patient agreed with the plan and demonstrated an understanding of the instructions.   The patient was advised to call back or seek an in-person evaluation if the symptoms worsen or if the condition fails to improve as anticipated.  I provided 25 minutes of non-face-to-face time during this encounter.  Bynum Bellows, LCSW 01/25/2022

## 2022-02-18 ENCOUNTER — Telehealth (INDEPENDENT_AMBULATORY_CARE_PROVIDER_SITE_OTHER): Payer: BC Managed Care – PPO | Admitting: Child and Adolescent Psychiatry

## 2022-02-18 DIAGNOSIS — F418 Other specified anxiety disorders: Secondary | ICD-10-CM | POA: Diagnosis not present

## 2022-02-18 DIAGNOSIS — F3341 Major depressive disorder, recurrent, in partial remission: Secondary | ICD-10-CM

## 2022-02-18 DIAGNOSIS — F840 Autistic disorder: Secondary | ICD-10-CM | POA: Diagnosis not present

## 2022-02-18 DIAGNOSIS — F900 Attention-deficit hyperactivity disorder, predominantly inattentive type: Secondary | ICD-10-CM

## 2022-02-18 MED ORDER — FLUOXETINE HCL 20 MG PO TABS: 60.0000 mg | ORAL_TABLET | Freq: Every day | ORAL | 1 refills | Status: DC

## 2022-02-18 NOTE — Progress Notes (Signed)
Virtual Visit via Video Note  I connected with Anne Ball on 02/18/22 at 10:30 AM EDT by a video enabled telemedicine application and verified that I am speaking with the correct person using two identifiers.  Location: Patient: home Provider: office   I discussed the limitations of evaluation and management by telemedicine and the availability of in person appointments. The patient expressed understanding and agreed to proceed.    I discussed the assessment and treatment plan with the patient. The patient was provided an opportunity to ask questions and all were answered. The patient agreed with the plan and demonstrated an understanding of the instructions.   The patient was advised to call back or seek an in-person evaluation if the symptoms worsen or if the condition fails to improve as anticipated.  I provided 20 minutes of non-face-to-face time during this encounter.   [name], MD  [facility] MD/PA/NP OP Progress Note  02/18/22 9:00 AM Anne Ball  MRN:  [id number]  Chief Complaint:    Medication management follow-up for anxiety, depression, ADHD.  HPI: This is a 21 year old African-American female with psychiatric history significant for major depressive disorder, anxiety, ADHD, ASD was seen and evaluated over telemedicine for medication management follow-up.  Anne Ball was present by herself, did not turn on the video because it impacts the connection therefore writer could not see her.  She reports that she has been doing "same", has not been a lot of changes recently.  She reports that her mood stays neutral, rates it at 5 out of 10, 10 being the best mood.  She also reports that her routine has been the same, plays with her dog, watches YouTube, playing video games, and does school work.  She reports that she is still working on her schoolwork, supposed to do 3 hours of school work to finish her course in 2 weeks but she does only half an hour or less because she  cannot stay focused longer periods of time.  I discussed with her about scheduling short settings to do her schoolwork and taking break which she also reports does not work.  We discussed to try and work toward committing herself to the work that she has to do.  She reluctantly agrees to this and says that she will work on this.  She denies excessive worries or anxiety, denies problems with sleep or appetite.  Reports that she has been more consistent with her medications, still forgets to take them about 2 days a week.  She reports that Concerta does help with focus and distractibility as compared to if she does not take it at all.  She denies any SI or HI.  She denies any new concerns for today's appointment.  We discussed to continue with current medications, continues to see her therapist every few weeks.  With her permission I called her mother, she did not pick up the phone, left the voicemail regarding continuation of medications and follow-up on December 1 at 8:00.  Also requested to call back if she has any questions.  Visit Diagnosis:    ICD-10-CM   1. Autism spectrum disorder  F84.0     2. Recurrent major depressive disorder, in partial remission (HCC)  F33.41 FLUoxetine (PROZAC) 20 MG tablet    3. Other specified anxiety disorders  F41.8 FLUoxetine (PROZAC) 20 MG tablet    4. Attention deficit hyperactivity disorder (ADHD), predominantly inattentive type  F90.0           Past Psychiatric History: As  mentioned in initial H&P, reviewed today, no change. She continues to take Prozac 40 mg once a day, Concerta 54 mg once a day and follow-up with individual therapist, started following Mr. Bynum Bellows. Past Medical History:  Past Medical History:  Diagnosis Date   Anxiety    Asthma    Depression     Past Surgical History:  Procedure Laterality Date   NO PAST SURGERIES      Family Psychiatric History: As mentioned in initial H&P, reviewed today, no change  Family History:   Family History  Problem Relation Age of Onset   Anxiety disorder Mother    Depression Mother    Diabetes Mother    Hypertension Mother    Hyperlipidemia Mother    Asthma Father    Migraines Neg Hx    Seizures Neg Hx    Autism Neg Hx    ADD / ADHD Neg Hx    Bipolar disorder Neg Hx    Schizophrenia Neg Hx     Social History:  Social History   Socioeconomic History   Marital status: Single    Spouse name: Not on file   Number of children: Not on file   Years of education: Not on file   Highest education level: 10th grade  Occupational History   Not on file  Tobacco Use   Smoking status: Never   Smokeless tobacco: Never  Vaping Use   Vaping Use: Never used  Substance and Sexual Activity   Alcohol use: Never   Drug use: Never   Sexual activity: Never  Other Topics Concern   Not on file  Social History Narrative   Lives with mom, dad and brother. She is in the 12th grade at [employer]. She enjoys eating, sleeping, and drawing.   Social Determinants of Health   Financial Resource Strain: Low Risk  (09/12/2018)   Overall Financial Resource Strain (CARDIA)    Difficulty of Paying Living Expenses: Not hard at all  Food Insecurity: No Food Insecurity (09/12/2018)   Hunger Vital Sign    Worried About Running Out of Food in the Last Year: Never true    Ran Out of Food in the Last Year: Never true  Transportation Needs: No Transportation Needs (09/12/2018)   PRAPARE - Administrator, Civil Service (Medical): No    Lack of Transportation (Non-Medical): No  Physical Activity: Inactive (09/12/2018)   Exercise Vital Sign    Days of Exercise per Week: 0 days    Minutes of Exercise per Session: 0 min  Stress: Stress Concern Present (09/12/2018)   [employer] of Occupational Health - Occupational Stress Questionnaire    Feeling of Stress : Rather much  Social Connections: Unknown (09/12/2018)   Social Connection and Isolation Panel [NHANES]    Frequency  of Communication with Friends and Family: Not on file    Frequency of Social Gatherings with Friends and Family: Not on file    Attends Religious Services: 1 to 4 times per year    Active Member of [employer] or Organizations: No    Attends Banker Meetings: Never    Marital Status: Never married    Allergies:  Allergies  Allergen Reactions   Cat Hair Extract    Dog Epithelium Allergy Skin Test    Gramineae Pollens    Horse Epithelium     Metabolic Disorder Labs: Lab Results  Component Value Date   HGBA1C 5.7 08/19/2011   No results found for: "PROLACTIN" Lab  Results  Component Value Date   CHOL 109 08/19/2011   TRIG 48 08/19/2011   HDL 35 (L) 08/19/2011   VLDL 10 08/19/2011   LDLCALC 64 08/19/2011   Lab Results  Component Value Date   TSH 1.065 12/29/2017    Therapeutic Level Labs: No results found for: "LITHIUM" No results found for: "VALPROATE" No results found for: "CBMZ"  Current Medications: Current Outpatient Medications  Medication Sig Dispense Refill   Albuterol (VENTOLIN IN) Inhale 2 puffs into the lungs every 4 (four) hours as needed (shortness of breath).      Cholecalciferol (VITAMIN D3) 50 MCG (2000 UT) capsule Take 2,000 Units by mouth 2 (two) times daily.     FLUoxetine (PROZAC) 20 MG tablet Take 3 tablets (60 mg total) by mouth daily. 90 tablet 1   fluticasone (FLONASE) 50 MCG/ACT nasal spray Place 2 sprays into both nostrils daily as needed for allergies or rhinitis.  3   metFORMIN (GLUCOPHAGE-XR) 500 MG 24 hr tablet Take 1,000 mg by mouth 2 (two) times daily.     methylphenidate (CONCERTA) 54 MG PO CR tablet Take 1 tablet (54 mg total) by mouth every morning. 30 tablet 0   methylphenidate (CONCERTA) 54 MG PO CR tablet Take 1 tablet (54 mg total) by mouth every morning. 30 tablet 0   montelukast (SINGULAIR) 10 MG tablet Take 10 mg by mouth at bedtime.     QVAR REDIHALER 80 MCG/ACT inhaler Inhale 2 puffs into the lungs 2 (two) times daily.      No current facility-administered medications for this visit.     Musculoskeletal: Strength & Muscle Tone: WNL Gait & Station: Normal Patient leans: N/A  Psychiatric Specialty Exam: There were no vitals filed for this visit.    There is no height or weight on file to calculate BMI.  Mental Status Exam: Appearance: unable to assess since virtual visit was over the telephone Attitude: calm, cooperative Activity: unable to assess since virtual visit was over the telephone Speech: normal rate, rhythm and volume Thought Process: Logical, linear, and goal-directed.  Associations: no looseness, tangentiality, circumstantiality, flight of ideas, thought blocking or word salad noted Thought Content: (abnormal/psychotic thoughts): no abnormal or delusional thought process evidenced SI/HI: denies Si/Hi Perception: no illusions or visual/auditory hallucinations noted; Mood & Affect: "good"/unable to assess since virtual visit was over the telephone  Judgment & Insight: both fair Attention and Concentration : Good Cognition : WNL Language : Good ADL - Intact      Screenings: GAD-7    Flowsheet Row Office Visit from 01/03/2022 in [facility]  Total GAD-7 Score 17      PHQ2-9    Flowsheet Row Office Visit from 01/03/2022 in [facility] Office Visit from 09/27/2021 in [facility] Office Visit from 08/16/2021 in [facility] Nutrition from 07/01/2021 in [facility]   PHQ-2 Total Score 5 3 2  0  PHQ-9 Total Score 20 12 12  --      Flowsheet Row Office Visit from 01/03/2022 in [facility] Office Visit from 09/27/2021 in [facility]  C-SSRS RISK CATEGORY No Risk No Risk        Assessment and Plan:   This is a 21 year old African-American female with psychiatric history significant for major  depressive disorder, anxiety, ADHD, ASD.  She reports stability with mood and anxiety,continues to struggle with procrastination with her school work, supportive counseling and solution focused therapy provided. Still intermittently  adherent to medications, encourage compliance and recommended to continue with current treatment. Mother was able to get her in [facility] offered program at [facility] but starting next spring. Also has intake with [facility] office. They will follow back with me again in 6 weeks currently if needed.       Plan as below.   #1 Depression (recurrent, in partial remission) - Continue Prozac 60 mg daily and improve compliance  - Continue ind therapy with Mr Yves Dill  - She has supportive parents which is a good prognostic and protective factor.     #2 Anxiety (stable) - Her presentation appears most consistent with generalized anxiety and social anxiety disorders. - Recommending medication and therapy as mentioned above.    #3 ADHD (chronic and unstable) -  Continue with Concerta 54 mg once a day   #4 Autism (Chronic, stable) - Testing done at [facility], and diagnosed with ASD per report.  -  Mother is trying to find resources for education and vocational rehab through [facility] and [place].    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 Mother however reports that they have continued with diamox and planning to find a different neuro.  - Defer management to neuro.  - Following with endo and nutrition service due to obesity and prediabetes.  - On metformin now.    30 minutes total time for encounter today which included chart review, pt evaluation, collaterals, medication and other treatment discussions, family counseling, medication orders and charting.            [name], MD        [name], MD 02/18/22, 10:30 am

## 2022-04-07 ENCOUNTER — Ambulatory Visit (INDEPENDENT_AMBULATORY_CARE_PROVIDER_SITE_OTHER): Payer: BC Managed Care – PPO | Admitting: Licensed Clinical Social Worker

## 2022-04-07 DIAGNOSIS — F900 Attention-deficit hyperactivity disorder, predominantly inattentive type: Secondary | ICD-10-CM

## 2022-04-07 DIAGNOSIS — F840 Autistic disorder: Secondary | ICD-10-CM

## 2022-04-07 DIAGNOSIS — F418 Other specified anxiety disorders: Secondary | ICD-10-CM

## 2022-04-08 NOTE — Progress Notes (Signed)
Virtual Visit via Video Note  I connected with Anne Ball on 04/08/22 at  1:00 PM EST by a video enabled telemedicine application and verified that I am speaking with the correct person using two identifiers.  Location: Patient: Home Provider: Office   I discussed the limitations of evaluation and management by telemedicine and the availability of in person appointments. The patient expressed understanding and agreed to proceed.   THERAPIST PROGRESS NOTE  Session Time: 1:00 pm-1:25 pm  Type of Therapy: Individual Therapy  Session #33  Purpose of Session/Treatment Goals addressed:  "Anne Ball will manage mood as evidenced by opening up, be more present, improve motivation, interact with her family, and have realistic expectations for herself"  Interventions: Therapist utilized CBT and Solution focused brief therapy to address focus and anxiety. Therapist provided support and empathy to patient during session. Therapist explored patient's anxieties and the importance of staying present.   Effectiveness: Patient was oriented x4 (person, place, situation, and time). Patient was casually dressed, and appropriately groom. Patient was alert, engaged, stressed, and cooperative. Patient noted that things have been the same since the last session. Patient did note that she got an email from her school that she is essentially locked out of her online school until Jan for not completing work. She said she was taking notes but had not taken a quiz in 30 days so it looks like she hadn't logged in. Patient is worried she will have to start over, and that if she is not in school or working her parents will kick her out. She noted her father had just made the statement the previous night that she needs to be in school or working in order to stay in the house. She worries her parents are going to ask her to leave. Patient understood that she has to stay present and not get ahead of herself. Patient is going  to start by having a conversation with her parents and explain what occurred.   Patient engaged in session. She responded well to interventions. Patient continues to meet criteria for Anxiety, NOS and ADHD, inattentive. Patient will continue in outpatient therapy due to being the least restrictive service to meet her needs. Patient made moderate progress on her goals at this time.   Suicidal/Homicidal: Negativewithout intent/plan  Plan: Return again in 4-6 weeks. Patient will have a conversation about school with her parents, and focus on the here and now rather than get too ahead of herself.   Diagnosis: Axis I: ADHD, inattentive type and Anxiety Disorder NOS    Axis II: No diagnosis    I discussed the assessment and treatment plan with the patient. The patient was provided an opportunity to ask questions and all were answered. The patient agreed with the plan and demonstrated an understanding of the instructions.   The patient was advised to call back or seek an in-person evaluation if the symptoms worsen or if the condition fails to improve as anticipated.  I provided 25 minutes of non-face-to-face time during this encounter.  Bynum Bellows, LCSW 04/08/2022

## 2022-04-22 ENCOUNTER — Telehealth (INDEPENDENT_AMBULATORY_CARE_PROVIDER_SITE_OTHER): Payer: BC Managed Care – PPO | Admitting: Child and Adolescent Psychiatry

## 2022-04-22 DIAGNOSIS — F418 Other specified anxiety disorders: Secondary | ICD-10-CM | POA: Diagnosis not present

## 2022-04-22 DIAGNOSIS — F900 Attention-deficit hyperactivity disorder, predominantly inattentive type: Secondary | ICD-10-CM | POA: Diagnosis not present

## 2022-04-22 DIAGNOSIS — F3341 Major depressive disorder, recurrent, in partial remission: Secondary | ICD-10-CM

## 2022-04-22 MED ORDER — FLUOXETINE HCL 20 MG PO TABS: 60.0000 mg | ORAL_TABLET | Freq: Every day | ORAL | 1 refills | Status: DC

## 2022-04-22 MED ORDER — METHYLPHENIDATE HCL ER (OSM) 54 MG PO TBCR: 54.0000 mg | EXTENDED_RELEASE_TABLET | ORAL | 0 refills | Status: DC

## 2022-04-22 NOTE — Progress Notes (Signed)
Virtual Visit via Video Note  I connected with Anne Ball on 04/22/22 at  8:00 AM EST by a video enabled telemedicine application and verified that I am speaking with the correct person using two identifiers.  Location: Patient: home Provider: office   I discussed the limitations of evaluation and management by telemedicine and the availability of in person appointments. The patient expressed understanding and agreed to proceed.    I discussed the assessment and treatment plan with the patient. The patient was provided an opportunity to ask questions and all were answered. The patient agreed with the plan and demonstrated an understanding of the instructions.   The patient was advised to call back or seek an in-person evaluation if the symptoms worsen or if the condition fails to improve as anticipated.    Anne Smalling, MD  [facility] MD/PA/NP OP Progress Note  04/22/22 9:00 AM Anne Ball  MRN:  [id number]  Chief Complaint:    Medication management follow-up for anxiety, depression, ADHD.  HPI: This is a 21 year old African-American female with psychiatric history significant for major depressive disorder, anxiety, ADHD, ASD was seen and evaluated over telemedicine for medication management follow-up.  Anne Ball was present by herself at her home and was evaluated alone.  With her verbal and written consent I spoke with her mother to obtain collateral information and discuss the treatment plan after speaking with [facility].  Anne Ball denies any new concerns for today's appointment, states that she has been feeling "neutral", about once a week she will become depressed in the context of questioning her self-worth, eventually challenges these thoughts and become out of it within a couple of hours.  She denies any suicidal thoughts during these times or arthritis.  She denies any HI.  She states that she is sleeping well at night, sleep has been restful.  She could not finish her high school  class and therefore now plans to attend [employer] for her GED class.  In her free time she is doing art, playing with her dogs and playing video games and enjoys this.  She denies problems with appetite.  In regards of anxiety, she states anxiety has been low.  She is still partially compliant with her medications, was not taking medications for a couple of weeks, restarted taking them, still compliant with only 50% of the time roughly.  Her mother says that she had an interview with vocational rehabilitation office yesterday, they will be following up after determining what services she would qualify.  We also discussed about switching to [facility] for additional services.  Mother otherwise denies any new concerns, corroborates the reports on patient's partial compliance to medications.  We discussed to continue with current medications and improved adherence.  She continues to see her therapist about once every few weeks, she will follow back with me again in 2 months or earlier if needed.   Visit Diagnosis:    ICD-10-CM   1. Other specified anxiety disorders  F41.8 FLUoxetine (PROZAC) 20 MG tablet    2. Recurrent major depressive disorder, in partial remission (HCC)  F33.41 FLUoxetine (PROZAC) 20 MG tablet    3. Attention deficit hyperactivity disorder (ADHD), predominantly inattentive type  F90.0 methylphenidate (CONCERTA) 54 MG PO CR tablet           Past Psychiatric History: As mentioned in initial H&P, reviewed today, no change. She continues to take Prozac 40 mg once a day, Concerta 54 mg once a day and follow-up with individual therapist, started  following Mr. Bynum Bellows. Past Medical History:  Past Medical History:  Diagnosis Date   Anxiety    Asthma    Depression     Past Surgical History:  Procedure Laterality Date   NO PAST SURGERIES      Family Psychiatric History: As mentioned in initial H&P, reviewed today, no change  Family History:  Family History  Problem  Relation Age of Onset   Anxiety disorder Mother    Depression Mother    Diabetes Mother    Hypertension Mother    Hyperlipidemia Mother    Asthma Father    Migraines Neg Hx    Seizures Neg Hx    Autism Neg Hx    ADD / ADHD Neg Hx    Bipolar disorder Neg Hx    Schizophrenia Neg Hx     Social History:  Social History   Socioeconomic History   Marital status: Single    Spouse name: Not on file   Number of children: Not on file   Years of education: Not on file   Highest education level: 10th grade  Occupational History   Not on file  Tobacco Use   Smoking status: Never   Smokeless tobacco: Never  Vaping Use   Vaping Use: Never used  Substance and Sexual Activity   Alcohol use: Never   Drug use: Never   Sexual activity: Never  Other Topics Concern   Not on file  Social History Narrative   Lives with mom, dad and brother. She is in the 12th grade at [employer]. She enjoys eating, sleeping, and drawing.   Social Determinants of Health   Financial Resource Strain: Low Risk  (09/12/2018)   Overall Financial Resource Strain (CARDIA)    Difficulty of Paying Living Expenses: Not hard at all  Food Insecurity: No Food Insecurity (09/12/2018)   Hunger Vital Sign    Worried About Running Out of Food in the Last Year: Never true    Ran Out of Food in the Last Year: Never true  Transportation Needs: No Transportation Needs (09/12/2018)   PRAPARE - Administrator, Civil Service (Medical): No    Lack of Transportation (Non-Medical): No  Physical Activity: Inactive (09/12/2018)   Exercise Vital Sign    Days of Exercise per Week: 0 days    Minutes of Exercise per Session: 0 min  Stress: Stress Concern Present (09/12/2018)   [employer] of Occupational Health - Occupational Stress Questionnaire    Feeling of Stress : Rather much  Social Connections: Unknown (09/12/2018)   Social Connection and Isolation Panel [NHANES]    Frequency of Communication with  Friends and Family: Not on file    Frequency of Social Gatherings with Friends and Family: Not on file    Attends Religious Services: 1 to 4 times per year    Active Member of [employer] or Organizations: No    Attends Banker Meetings: Never    Marital Status: Never married    Allergies:  Allergies  Allergen Reactions   Cat Hair Extract    Dog Epithelium Allergy Skin Test    Gramineae Pollens    Horse Epithelium     Metabolic Disorder Labs: Lab Results  Component Value Date   HGBA1C 5.7 08/19/2011   No results found for: "PROLACTIN" Lab Results  Component Value Date   CHOL 109 08/19/2011   TRIG 48 08/19/2011   HDL 35 (L) 08/19/2011   VLDL 10 08/19/2011   LDLCALC 64  08/19/2011   Lab Results  Component Value Date   TSH 1.065 12/29/2017    Therapeutic Level Labs: No results found for: "LITHIUM" No results found for: "VALPROATE" No results found for: "CBMZ"  Current Medications: Current Outpatient Medications  Medication Sig Dispense Refill   Albuterol (VENTOLIN IN) Inhale 2 puffs into the lungs every 4 (four) hours as needed (shortness of breath).      Cholecalciferol (VITAMIN D3) 50 MCG (2000 UT) capsule Take 2,000 Units by mouth 2 (two) times daily.     FLUoxetine (PROZAC) 20 MG tablet Take 3 tablets (60 mg total) by mouth daily. 90 tablet 1   fluticasone (FLONASE) 50 MCG/ACT nasal spray Place 2 sprays into both nostrils daily as needed for allergies or rhinitis.  3   metFORMIN (GLUCOPHAGE-XR) 500 MG 24 hr tablet Take 1,000 mg by mouth 2 (two) times daily.     methylphenidate (CONCERTA) 54 MG PO CR tablet Take 1 tablet (54 mg total) by mouth every morning. 30 tablet 0   methylphenidate (CONCERTA) 54 MG PO CR tablet Take 1 tablet (54 mg total) by mouth every morning. 30 tablet 0   montelukast (SINGULAIR) 10 MG tablet Take 10 mg by mouth at bedtime.     QVAR REDIHALER 80 MCG/ACT inhaler Inhale 2 puffs into the lungs 2 (two) times daily.     No current  facility-administered medications for this visit.     Musculoskeletal: Strength & Muscle Tone: WNL Gait & Station: Normal Patient leans: N/A  Psychiatric Specialty Exam: There were no vitals filed for this visit.    There is no height or weight on file to calculate BMI.  Mental Status Exam: Appearance: casually dressed; well groomed; no overt signs of trauma or distress noted Attitude: calm, cooperative with fair eye contact Activity: No PMA/PMR, no tics/no tremors; no EPS noted  Speech: normal rate, rhythm and volume Thought Process: Logical, linear, and goal-directed.  Associations: no looseness, tangentiality, circumstantiality, flight of ideas, thought blocking or word salad noted Thought Content: (abnormal/psychotic thoughts): no abnormal or delusional thought process evidenced SI/HI: denies Si/Hi Perception: no illusions or visual/auditory hallucinations noted; no response to internal stimuli demonstrated Mood & Affect: "neutral"/restricted Judgment & Insight: both fair Attention and Concentration : Good Cognition : WNL Language : Good ADL - Intact       Screenings: GAD-7    Flowsheet Row Office Visit from 01/03/2022 in [facility]  Total GAD-7 Score 17      PHQ2-9    Flowsheet Row Office Visit from 01/03/2022 in [facility] Office Visit from 09/27/2021 in [facility] Office Visit from 08/16/2021 in [facility] Nutrition from 07/01/2021 in [facility] [place]  PHQ-2 Total Score 5 3 2  0  PHQ-9 Total Score 20 12 12  --      Flowsheet Row Office Visit from 01/03/2022 in [facility] Office Visit from 09/27/2021 in [facility]  C-SSRS RISK CATEGORY No Risk No Risk        Assessment and Plan:   This is a 21 year old African-American female with psychiatric history significant for  major depressive disorder, anxiety, ADHD, ASD.  She says that her mood is stable, anxiety is low, continues to struggle with procrastination and could not finish her high school class.  She is planning to attend GED classes at [employer].  Recently completed an appointment with vocational rehabilitation office and [facility].  She is still partially compliant to  medications and therefore again provided psychoeducation to medication adherence.  They will follow back again in about 2 months or earlier if needed.        Plan as below.   #1 Depression (recurrent, in partial remission) - Continue Prozac 60 mg daily and improve compliance  - Continue ind therapy with Mr Pollyann SavoySheets  - She has supportive parents which is a good prognostic and protective factor.     #2 Anxiety (stable) - Her presentation appears most consistent with generalized anxiety and social anxiety disorders. - Recommending medication and therapy as mentioned above.    #3 ADHD (chronic and unstable) -  Continue with Concerta 54 mg once a day   #4 Autism (Chronic, stable) - Testing done at [facility], and diagnosed with ASD per report.  -  Mother is trying to find resources for education and [facility] [place] [place].    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 Mother however reports that they have continued with diamox and planning to find a different neuro.  - Defer management to neuro.  - Following with endo and nutrition service due to obesity and prediabetes.  - On metformin now.    30 minutes total time for encounter today which included chart review, pt evaluation, collaterals, medication and other treatment discussions, family counseling, medication orders and charting.            Anne SmallingHiren M Jayziah Bankhead, MD       Anne SmallingHiren M Eliab Closson, MD 04/22/22,  10:30 am

## 2022-04-25 ENCOUNTER — Ambulatory Visit (INDEPENDENT_AMBULATORY_CARE_PROVIDER_SITE_OTHER): Payer: BC Managed Care – PPO | Admitting: Licensed Clinical Social Worker

## 2022-04-25 DIAGNOSIS — F900 Attention-deficit hyperactivity disorder, predominantly inattentive type: Secondary | ICD-10-CM | POA: Diagnosis not present

## 2022-04-25 NOTE — Progress Notes (Signed)
Virtual Visit via Video Ball  I connected with Anne Ball on 04/25/22 at  1:00 PM EST by a video enabled telemedicine application and verified that I am speaking with the correct person using two identifiers.  Location: Patient: Home Provider: Office   I discussed the limitations of evaluation and management by telemedicine and the availability of in person appointments. The patient expressed understanding and agreed to proceed.   Anne Ball  Session Time: 1:00 pm-1:20 pm  Type of Therapy: Individual Therapy  Session #34  Purpose of Session/Treatment Goals addressed:  "Anne Ball will manage mood as evidenced by opening up, be more present, improve motivation, interact with her family, and have realistic expectations for herself"  Interventions: Anne utilized CBT and Solution focused brief therapy to address focus and anxiety. Anne provided support and empathy to patient during session. Anne explored patient's mood and her parent's reaction to not being allowed to continue in online school. Anne worked with patient to identify the next steps.   Effectiveness: Patient was oriented x4 (person, place, situation, and time). Patient was casually dressed, and appropriately groomed. Patient was alert, engaged, pleasant, and cooperative. Patient noted that things have been fine since last session. Patient's parents reacted well to her not being allowed to continue in online classes. She had a discussion with her parents and they decided that patient would go in person to complete a GED. Patient has been occupying her time with doing art. She feels like her mood is stable.   Patient engaged in session. She responded well to interventions. Patient continues to meet criteria for Anxiety, NOS and ADHD, inattentive. Patient will continue in outpatient therapy due to being the least restrictive service to meet her needs. Patient made moderate progress on her goals at this  time.   Suicidal/Homicidal: Negativewithout intent/plan  Plan: Return again in 4-6 weeks. Patient will focus on getting a GED and doing art.   Diagnosis: Axis I: ADHD, inattentive type and Anxiety Disorder NOS    Axis II: No diagnosis    I discussed the assessment and treatment plan with the patient. The patient was provided an opportunity to ask questions and all were answered. The patient agreed with the plan and demonstrated an understanding of the instructions.   The patient was advised to call back or seek an in-person evaluation if the symptoms worsen or if the condition fails to improve as anticipated.  I provided 20 minutes of non-face-to-face time during this encounter.  Bynum Bellows, LCSW 04/25/2022

## 2022-05-24 ENCOUNTER — Ambulatory Visit (INDEPENDENT_AMBULATORY_CARE_PROVIDER_SITE_OTHER): Payer: BC Managed Care – PPO | Admitting: Licensed Clinical Social Worker

## 2022-05-24 DIAGNOSIS — F900 Attention-deficit hyperactivity disorder, predominantly inattentive type: Secondary | ICD-10-CM | POA: Diagnosis not present

## 2022-05-25 NOTE — Progress Notes (Signed)
Virtual Visit via Video Note  I connected with Anne Ball on 05/25/22 at  1:00 PM EST by a video enabled telemedicine application and verified that I am speaking with the correct person using two identifiers.  Location: Patient: Home Provider: Office   I discussed the limitations of evaluation and management by telemedicine and the availability of in person appointments. The patient expressed understanding and agreed to proceed.   THERAPIST PROGRESS NOTE  Session Time: 1:00 pm-1:20 pm  Type of Therapy: Individual Therapy  Session #35  Purpose of Session/Treatment Goals addressed:  "Swan will manage mood as evidenced by opening up, be more present, improve motivation, interact with her family, and have realistic expectations for herself"  Interventions: Therapist utilized CBT and Solution focused brief therapy to address focus and anxiety. Therapist provided support and empathy to patient during session. Therapist worked with patient to identify what she needs to focus on currently and her next steps.   Effectiveness: Patient was oriented x4 (person, place, situation, and time). Patient was casually dressed, and appropriately groomed. Patient was alert, engaged, pleasant, and cooperative. Patient noted that things have been the same. She is not in school and is not registered to attend school at the moment. Patient is considering a GED. Patient is considering employment. She noted that she needs to be presentable for employment which would include taking care of herself. Patient has not been exercising and misses her medication at times. Patient is going to focus on taking her medication. She is not interested in exercise even though her doctors have recommended it. Patient is focusing on her art but is not doing much outside of that.   Patient engaged in session. She responded well to interventions. Patient continues to meet criteria for Anxiety, NOS and ADHD, inattentive. Patient  will continue in outpatient therapy due to being the least restrictive service to meet her needs. Patient made moderate progress on her goals at this time.   Suicidal/Homicidal: Negativewithout intent/plan  Plan: Return again in 4-6 weeks. Patient will focus on self care and consider employment.   Diagnosis: Axis I: ADHD, inattentive type and Anxiety Disorder NOS    Axis II: No diagnosis    I discussed the assessment and treatment plan with the patient. The patient was provided an opportunity to ask questions and all were answered. The patient agreed with the plan and demonstrated an understanding of the instructions.   The patient was advised to call back or seek an in-person evaluation if the symptoms worsen or if the condition fails to improve as anticipated.  I provided 20 minutes of non-face-to-face time during this encounter.  Glori Bickers, LCSW 05/25/2022

## 2022-06-24 ENCOUNTER — Telehealth (INDEPENDENT_AMBULATORY_CARE_PROVIDER_SITE_OTHER): Payer: BC Managed Care – PPO | Admitting: Child and Adolescent Psychiatry

## 2022-06-24 DIAGNOSIS — F900 Attention-deficit hyperactivity disorder, predominantly inattentive type: Secondary | ICD-10-CM | POA: Diagnosis not present

## 2022-06-24 DIAGNOSIS — F3341 Major depressive disorder, recurrent, in partial remission: Secondary | ICD-10-CM

## 2022-06-24 DIAGNOSIS — F418 Other specified anxiety disorders: Secondary | ICD-10-CM

## 2022-06-24 MED ORDER — FLUOXETINE HCL 20 MG PO TABS: 60.0000 mg | ORAL_TABLET | Freq: Every day | ORAL | 1 refills | Status: DC

## 2022-06-24 MED ORDER — METHYLPHENIDATE HCL ER (OSM) 54 MG PO TBCR: 54.0000 mg | EXTENDED_RELEASE_TABLET | ORAL | 0 refills | Status: DC

## 2022-06-24 NOTE — Progress Notes (Signed)
Virtual Visit via Video Note  I connected with Anne Ball on 06/24/22 at  8:00 AM EST by a video enabled telemedicine application and verified that I am speaking with the correct person using two identifiers.  Location: Patient: home Provider: office   I discussed the limitations of evaluation and management by telemedicine and the availability of in person appointments. The patient expressed understanding and agreed to proceed.    I discussed the assessment and treatment plan with the patient. The patient was provided an opportunity to ask questions and all were answered. The patient agreed with the plan and demonstrated an understanding of the instructions.   The patient was advised to call back or seek an in-person evaluation if the symptoms worsen or if the condition fails to improve as anticipated.    Anne Erm, MD  [facility] MD/PA/NP OP Progress Note  06/24/22 8:00 AM Anne Ball  MRN:  [id number]  Chief Complaint:    Medication management follow-up for anxiety, depression, ADHD.  HPI: This is a 22 year old African-American female with psychiatric history significant for major depressive disorder, anxiety, ADHD, ASD was seen and evaluated over telemedicine for medication management follow-up.  Anne Ball was present by herself at her home and was evaluated alone.  With her verbal and written consent I spoke with her mother to obtain collateral information and discuss her treatment plan after speaking with Anne Ball.  Appointment was scheduled for video visit however because of the technological issues with poor Internet, the appointment was conducted over audio on telemedicine.   Anne Ball denies any new concerns for today's appointment, states that she is overall doing well, has not noticed low lows or depressed mood recently.  When asked what has been going good, she says that her mother is making her resume more adult responsibilities such as budgeting for herself, making meals  for herself and she is also working on cleaning her room.  She also reports that they went to vocational rehabilitation office and currently waiting for prospective jobs, she is excited about it.  She says that recently her mom made her go shopping with her, she had some anxiety but she was able to manage it well.  At home she continues to take care of her room, making meals and playing videogames.  She is planning to also volunteer.  She denies anhedonia, problems with sleep or appetite, denies any problems with energy or concentration, denies any SI or HI.  She still misses 2 or 3 days worth of medication every week, and psychoeducation was provided to be more consistent with her medications.  She verbalized understanding.  Her mother provides collateral information and reports that overall she seems to be doing okay, she has been teaching her responsibilities of adulthood such as budgeting, making meals for herself.  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  Discussed with patient and parent to continue with current medications.  Mother was informed the patient will see me again in 2 months or early if needed.  Mother verbalized understanding and agreed with this plan.   Visit Diagnosis:    ICD-10-CM   1. Other specified anxiety disorders  F41.8 FLUoxetine (PROZAC) 20 MG tablet    2. Recurrent major depressive disorder, in partial remission (HCC)  F33.41 FLUoxetine (PROZAC) 20 MG tablet    3. Attention deficit hyperactivity disorder (ADHD), predominantly inattentive type  F90.0 methylphenidate (CONCERTA) 54 MG PO CR tablet  Past Psychiatric History: As mentioned in initial H&P, reviewed today, no change. She continues to take Prozac 40 mg once a day, Concerta 54 mg once a day and follow-up with individual therapist, started following Mr. Bynum Bellows. Past Medical History:  Past Medical History:  Diagnosis Date    Anxiety    Asthma    Depression     Past Surgical History:  Procedure Laterality Date   NO PAST SURGERIES      Family Psychiatric History: As mentioned in initial H&P, reviewed today, no change  Family History:  Family History  Problem Relation Age of Onset   Anxiety disorder Mother    Depression Mother    Diabetes Mother    Hypertension Mother    Hyperlipidemia Mother    Asthma Father    Migraines Neg Hx    Seizures Neg Hx    Autism Neg Hx    ADD / ADHD Neg Hx    Bipolar disorder Neg Hx    Schizophrenia Neg Hx     Social History:  Social History   Socioeconomic History   Marital status: Single    Spouse name: Not on file   Number of children: Not on file   Years of education: Not on file   Highest education level: 10th grade  Occupational History   Not on file  Tobacco Use   Smoking status: Never   Smokeless tobacco: Never  Vaping Use   Vaping Use: Never used  Substance and Sexual Activity   Alcohol use: Never   Drug use: Never   Sexual activity: Never  Other Topics Concern   Not on file  Social History Narrative   Lives with mom, dad and brother. She is in the 12th grade at [employer]. She enjoys eating, sleeping, and drawing.   Social Determinants of Health   Financial Resource Strain: Low Risk  (09/12/2018)   Overall Financial Resource Strain (CARDIA)    Difficulty of Paying Living Expenses: Not hard at all  Food Insecurity: No Food Insecurity (09/12/2018)   Hunger Vital Sign    Worried About Running Out of Food in the Last Year: Never true    Ran Out of Food in the Last Year: Never true  Transportation Needs: No Transportation Needs (09/12/2018)   PRAPARE - Administrator, Civil Service (Medical): No    Lack of Transportation (Non-Medical): No  Physical Activity: Inactive (09/12/2018)   Exercise Vital Sign    Days of Exercise per Week: 0 days    Minutes of Exercise per Session: 0 min  Stress: Stress Concern Present (09/12/2018)    [employer] of Occupational Health - Occupational Stress Questionnaire    Feeling of Stress : Rather much  Social Connections: Unknown (09/12/2018)   Social Connection and Isolation Panel [NHANES]    Frequency of Communication with Friends and Family: Not on file    Frequency of Social Gatherings with Friends and Family: Not on file    Attends Religious Services: 1 to 4 times per year    Active Member of [employer] or Organizations: No    Attends Banker Meetings: Never    Marital Status: Never married    Allergies:  Allergies  Allergen Reactions   Cat Hair Extract    Dog Epithelium Allergy Skin Test    Gramineae Pollens    Horse Epithelium     Metabolic Disorder Labs: Lab Results  Component Value Date   HGBA1C 5.7 08/19/2011   No results  found for: "PROLACTIN" Lab Results  Component Value Date   CHOL 109 08/19/2011   TRIG 48 08/19/2011   HDL 35 (L) 08/19/2011   VLDL 10 08/19/2011   LDLCALC 64 08/19/2011   Lab Results  Component Value Date   TSH 1.065 12/29/2017    Therapeutic Level Labs: No results found for: "LITHIUM" No results found for: "VALPROATE" No results found for: "CBMZ"  Current Medications: Current Outpatient Medications  Medication Sig Dispense Refill   Albuterol (VENTOLIN IN) Inhale 2 puffs into the lungs every 4 (four) hours as needed (shortness of breath).      Cholecalciferol (VITAMIN D3) 50 MCG (2000 UT) capsule Take 2,000 Units by mouth 2 (two) times daily.     FLUoxetine (PROZAC) 20 MG tablet Take 3 tablets (60 mg total) by mouth daily. 90 tablet 1   fluticasone (FLONASE) 50 MCG/ACT nasal spray Place 2 sprays into both nostrils daily as needed for allergies or rhinitis.  3   metFORMIN (GLUCOPHAGE-XR) 500 MG 24 hr tablet Take 1,000 mg by mouth 2 (two) times daily.     methylphenidate (CONCERTA) 54 MG PO CR tablet Take 1 tablet (54 mg total) by mouth every morning. 30 tablet 0   methylphenidate (CONCERTA) 54 MG PO CR tablet Take 1  tablet (54 mg total) by mouth every morning. 30 tablet 0   montelukast (SINGULAIR) 10 MG tablet Take 10 mg by mouth at bedtime.     QVAR REDIHALER 80 MCG/ACT inhaler Inhale 2 puffs into the lungs 2 (two) times daily.     No current facility-administered medications for this visit.     Musculoskeletal: Strength & Muscle Tone: WNL Gait & Station: Normal Patient leans: N/A  Psychiatric Specialty Exam: There were no vitals filed for this visit.    There is no height or weight on file to calculate BMI.  Mental Status Exam: Appearance: unable to assess since virtual visit was over the telephone Attitude: calm, cooperative  Activity: unable to assess since virtual visit was over the telephone Speech: normal rate, rhythm and volume Thought Process: Logical, linear, and goal-directed.  Associations: no looseness, tangentiality, circumstantiality, flight of ideas, thought blocking or word salad noted Thought Content: (abnormal/psychotic thoughts): no abnormal or delusional thought process evidenced SI/HI: denies Si/Hi Perception: no illusions or visual/auditory hallucinations noted; Mood & Affect: "good"/unable to assess since virtual visit was over the telephone  Judgment & Insight: both fair Attention and Concentration : Good Cognition : WNL Language : Good ADL - Intact       Screenings: [place] Office Visit from 01/03/2022 in [place]  Total GAD-7 Score 17      PHQ2-9    [place] Office Visit from 01/03/2022 in [place] Office Visit from 09/27/2021 in [place] Office Visit from 08/16/2021 in [place] Nutrition from 07/01/2021 in [place] at [facility] Total Score 5 3 2  0  PHQ-9 Total Score 20 12 12  --      [place]  Office Visit from 01/03/2022 in [place] Office Visit from 09/27/2021 in [place] No Risk No Risk        Assessment and Plan:   This is a 22 year old African-American female with psychiatric history significant for major depressive disorder, anxiety, ADHD, ASD.  She reports stability with mood and low anxiety, appears to be working more towards independence with activities of daily life.they have worked with vocational rehabilitation office and she is now assigned a job Leisure centre manager. Working on planning to find the work.   She is still partially compliant to medications and therefore again provided psychoeducation to medication adherence.  They will follow back again in about 2 months or earlier if needed.        Plan as below.   #1 Depression (recurrent, in partial remission) - Continue Prozac 60 mg daily and improve compliance  - Continue ind therapy with Mr Yves Dill  - She has supportive parents which is a good prognostic and protective factor.     #2 Anxiety (stable) - Her presentation appears most consistent with generalized anxiety and social anxiety disorders. - Recommending medication and therapy as mentioned above.    #3 ADHD (chronic and unstable) -  Continue with Concerta 54 mg once a day   #4 Autism (Chronic, stable) - Testing done at [facility], and diagnosed with ASD per report.  -  Mother is trying to find resources for education and vocational rehab through [facility] and [place]. Also provided name of [facility] habilitation services which works with patients with ASD/IDD.    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 Mother however reports that they have continued with diamox and planning to find a different neuro.  - Defer  management to neuro.  - Following with endo and nutrition service due to obesity and prediabetes.  - On metformin now.    30 minutes total time for encounter today which included chart review, pt evaluation, collaterals, medication and other treatment discussions, family counseling, medication orders and charting.            Anne Erm, MD       Anne Erm, MD 06/24/22, 10:30 am

## 2022-08-26 ENCOUNTER — Telehealth (INDEPENDENT_AMBULATORY_CARE_PROVIDER_SITE_OTHER): Payer: BC Managed Care – PPO | Admitting: Child and Adolescent Psychiatry

## 2022-08-26 DIAGNOSIS — F900 Attention-deficit hyperactivity disorder, predominantly inattentive type: Secondary | ICD-10-CM | POA: Diagnosis not present

## 2022-08-26 DIAGNOSIS — F418 Other specified anxiety disorders: Secondary | ICD-10-CM

## 2022-08-26 DIAGNOSIS — F3341 Major depressive disorder, recurrent, in partial remission: Secondary | ICD-10-CM | POA: Diagnosis not present

## 2022-08-26 MED ORDER — FLUOXETINE HCL 20 MG PO TABS: 60.0000 mg | ORAL_TABLET | Freq: Every day | ORAL | 1 refills | Status: DC

## 2022-08-26 MED ORDER — METHYLPHENIDATE HCL ER (OSM) 54 MG PO TBCR: 54.0000 mg | EXTENDED_RELEASE_TABLET | ORAL | 0 refills | Status: DC

## 2022-08-26 NOTE — Progress Notes (Signed)
Virtual Visit via Video Note  I connected with Anne Ball on 08/26/22 at  8:00 AM EDT by a video enabled telemedicine application and verified that I am speaking with the correct person using two identifiers.  Location: Patient: home Provider: office   I discussed the limitations of evaluation and management by telemedicine and the availability of in person appointments. The patient expressed understanding and agreed to proceed.    I discussed the assessment and treatment plan with the patient. The patient was provided an opportunity to ask questions and all were answered. The patient agreed with the plan and demonstrated an understanding of the instructions.   The patient was advised to call back or seek an in-person evaluation if the symptoms worsen or if the condition fails to improve as anticipated.    [name], MD  [facility] MD/PA/NP OP Progress Note  08/26/22 8:00 AM Anne Ball  MRN:  [id number]  Chief Complaint:    Medication management follow-up for anxiety, depression, ADHD.  HPI: This is a 22 year old African-American female with psychiatric history significant for major depressive disorder, anxiety, ADHD, ASD was seen and evaluated over telemedicine for medication management follow-up.  Anne Ball was present by herself at her home and was evaluated alone.  With her verbal and written consent I spoke with her mother to obtain collateral information and discuss her treatment plan over the phone.  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  She says that she got this job through vocational rehabilitation.  She says that she enjoys working, has good coworkers, does struggle with physical requirement of the job but overall doing okay.  She says that once she is back home, she has been  sleeping.  She says that she has been taking care of herself.  She says that her mood has been "good", denies excessive worries or anxiety, denies any low lows, denies any SI or HI, eats well.  She says that she has been consistently taking her medications as prescribed.  Her mother reports that Anne Ball has been more motivated, more active in taking care of her things, prepress lunch for herself, to go out on shopping for the things she needs once a week, and has been working.  She says that she is also participating in a study for psychotherapy through [facility] and [place] [place] where she attends psychotherapy once a week and also gets paid for it.  Therefore she has not been seeing her previous therapist.  Because of her overall improvement we discussed to continue with current medications.  They verbalized understanding and agreed with this plan.  They will follow back again in about 2 months or earlier if needed.   Visit Diagnosis:    ICD-10-CM   1. Attention deficit hyperactivity disorder (ADHD), predominantly inattentive type  F90.0 methylphenidate (CONCERTA) 54 MG PO CR tablet    methylphenidate (CONCERTA) 54 MG PO CR tablet    2. Other specified anxiety disorders  F41.8 FLUoxetine (PROZAC) 20 MG tablet    3. Recurrent major depressive disorder, in partial remission  F33.41 FLUoxetine (PROZAC) 20 MG tablet             Past Psychiatric History: As mentioned in initial H&P, reviewed today, no change. She continues to  take Prozac 40 mg once a day, Concerta 54 mg once a day and follow-up with individual therapist, started following Mr. Bynum Bellows. Past Medical History:  Past Medical History:  Diagnosis Date   Anxiety    Asthma    Depression     Past Surgical History:  Procedure Laterality Date   NO PAST SURGERIES      Family Psychiatric History: As mentioned in initial H&P, reviewed today, no change  Family History:  Family History  Problem Relation Age of Onset    Anxiety disorder Mother    Depression Mother    Diabetes Mother    Hypertension Mother    Hyperlipidemia Mother    Asthma Father    Migraines Neg Hx    Seizures Neg Hx    Autism Neg Hx    ADD / ADHD Neg Hx    Bipolar disorder Neg Hx    Schizophrenia Neg Hx     Social History:  Social History   Socioeconomic History   Marital status: Single    Spouse name: Not on file   Number of children: Not on file   Years of education: Not on file   Highest education level: 10th grade  Occupational History   Not on file  Tobacco Use   Smoking status: Never   Smokeless tobacco: Never  Vaping Use   Vaping Use: Never used  Substance and Sexual Activity   Alcohol use: Never   Drug use: Never   Sexual activity: Never  Other Topics Concern   Not on file  Social History Narrative   Lives with mom, dad and brother. She is in the 12th grade at [employer]. She enjoys eating, sleeping, and drawing.   Social Determinants of Health   Financial Resource Strain: Low Risk  (09/12/2018)   Overall Financial Resource Strain (CARDIA)    Difficulty of Paying Living Expenses: Not hard at all  Food Insecurity: No Food Insecurity (09/12/2018)   Hunger Vital Sign    Worried About Running Out of Food in the Last Year: Never true    Ran Out of Food in the Last Year: Never true  Transportation Needs: No Transportation Needs (09/12/2018)   PRAPARE - Administrator, Civil Service (Medical): No    Lack of Transportation (Non-Medical): No  Physical Activity: Inactive (09/12/2018)   Exercise Vital Sign    Days of Exercise per Week: 0 days    Minutes of Exercise per Session: 0 min  Stress: Stress Concern Present (09/12/2018)   Anne Ball of Occupational Health - Occupational Stress Questionnaire    Feeling of Stress : Rather much  Social Connections: Unknown (09/12/2018)   Social Connection and Isolation Panel [NHANES]    Frequency of Communication with Friends and Family: Not on file     Frequency of Social Gatherings with Friends and Family: Not on file    Attends Religious Services: 1 to 4 times per year    Active Member of [employer] or Organizations: No    Attends Banker Meetings: Never    Marital Status: Never married    Allergies:  Allergies  Allergen Reactions   Cat Hair Extract    Dog Epithelium (Canis Lupus Familiaris)    Gramineae Pollens    Horse Epithelium     Metabolic Disorder Labs: Lab Results  Component Value Date   HGBA1C 5.7 08/19/2011   No results found for: "PROLACTIN" Lab Results  Component Value Date   CHOL 109 08/19/2011  TRIG 48 08/19/2011   HDL 35 (L) 08/19/2011   VLDL 10 08/19/2011   LDLCALC 64 08/19/2011   Lab Results  Component Value Date   TSH 1.065 12/29/2017    Therapeutic Level Labs: No results found for: "LITHIUM" No results found for: "VALPROATE" No results found for: "CBMZ"  Current Medications: Current Outpatient Medications  Medication Sig Dispense Refill   Albuterol (VENTOLIN IN) Inhale 2 puffs into the lungs every 4 (four) hours as needed (shortness of breath).      Cholecalciferol (VITAMIN D3) 50 MCG (2000 UT) capsule Take 2,000 Units by mouth 2 (two) times daily.     FLUoxetine (PROZAC) 20 MG tablet Take 3 tablets (60 mg total) by mouth daily. 90 tablet 1   fluticasone (FLONASE) 50 MCG/ACT nasal spray Place 2 sprays into both nostrils daily as needed for allergies or rhinitis.  3   metFORMIN (GLUCOPHAGE-XR) 500 MG 24 hr tablet Take 1,000 mg by mouth 2 (two) times daily.     methylphenidate (CONCERTA) 54 MG PO CR tablet Take 1 tablet (54 mg total) by mouth every morning. 30 tablet 0   methylphenidate (CONCERTA) 54 MG PO CR tablet Take 1 tablet (54 mg total) by mouth every morning. 30 tablet 0   montelukast (SINGULAIR) 10 MG tablet Take 10 mg by mouth at bedtime.     QVAR REDIHALER 80 MCG/ACT inhaler Inhale 2 puffs into the lungs 2 (two) times daily.     No current facility-administered  medications for this visit.     Musculoskeletal: Strength & Muscle Tone: WNL Gait & Station: Normal Patient leans: N/A  Psychiatric Specialty Exam: There were no vitals filed for this visit.    There is no height or weight on file to calculate BMI.  Mental Status Exam: Appearance: casually dressed; well groomed; no overt signs of trauma or distress noted Attitude: calm, cooperative with good eye contact Activity: No PMA/PMR, no tics/no tremors; no EPS noted  Speech: normal rate, rhythm and volume Thought Process: Logical, linear, and goal-directed.  Associations: no looseness, tangentiality, circumstantiality, flight of ideas, thought blocking or word salad noted Thought Content: (abnormal/psychotic thoughts): no abnormal or delusional thought process evidenced SI/HI: denies Si/Hi Perception: no illusions or visual/auditory hallucinations noted; no response to internal stimuli demonstrated Mood & Affect: "good"/full range, neutral Judgment & Insight: both fair Attention and Concentration : Good Cognition : WNL Language : Good ADL - Intact       Screenings: GAD-7    [place] [facility]  Total GAD-7 Score 17      PHQ2-9    Flowsheet Row [facility] Nutrition from [facility]20[facility]  PHQ-2 Total Score 5 3 2  0  PHQ-9 Total Score 20 12 12  --      Flowsheet Row [facility]  C-SSRS RISK CATEGORY No Risk No Risk        Assessment and  Plan:   This is a 22 year old African-American female with psychiatric history significant for major depressive disorder, anxiety, ADHD, ASD.  She appears to have improvement with mood and low anxiety, seems to be more  independent with her activities of daily life and also has been working part-time and more consistent with her medications.  Recommended to continue with current medications.  She is also attending therapy once a week through old study for next 16 to 18 weeks.  Recommended to continue.  They will follow-up again in about 2 months or earlier if needed.       Plan as below.   #1 Depression (recurrent, in partial remission) - Continue Prozac 60 mg daily and improve compliance  - [facility] - She has supportive parents which is a good prognostic and protective factor.     #2 Anxiety (stable) - Her presentation appears most consistent with generalized anxiety and social anxiety disorders. - Recommending medication and therapy as mentioned above.    #3 ADHD (chronic and stable) -  Continue with Concerta 54 mg once a day   #4 Autism (Chronic, stable) - [facility], and diagnosed with ASD per report.  -  Currently working with vocational rehabilitation services and has part time employment.    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 Mother however reports that they have continued with diamox and planning to find a different neuro.  - Defer management to neuro.  - Following with endo and nutrition service due to obesity and prediabetes.  - On metformin now.    MDM = 2 or more chronic stable conditions + med management           Darcel SmallingHiren M Azarria Balint, MD       Darcel SmallingHiren M Kyndel Egger, MD 08/26/22, 10:30 am

## 2022-10-26 ENCOUNTER — Telehealth (INDEPENDENT_AMBULATORY_CARE_PROVIDER_SITE_OTHER): Payer: BC Managed Care – PPO | Admitting: Child and Adolescent Psychiatry

## 2022-10-26 DIAGNOSIS — F418 Other specified anxiety disorders: Secondary | ICD-10-CM | POA: Diagnosis not present

## 2022-10-26 DIAGNOSIS — F900 Attention-deficit hyperactivity disorder, predominantly inattentive type: Secondary | ICD-10-CM | POA: Diagnosis not present

## 2022-10-26 DIAGNOSIS — F3341 Major depressive disorder, recurrent, in partial remission: Secondary | ICD-10-CM | POA: Diagnosis not present

## 2022-10-26 MED ORDER — METHYLPHENIDATE HCL ER (OSM) 54 MG PO TBCR: 54.0000 mg | EXTENDED_RELEASE_TABLET | ORAL | 0 refills | Status: DC

## 2022-10-26 MED ORDER — FLUOXETINE HCL 20 MG PO TABS: 60.0000 mg | ORAL_TABLET | Freq: Every day | ORAL | 1 refills | Status: DC

## 2022-10-26 NOTE — Progress Notes (Signed)
Virtual Visit via Video Note  I connected with Anne Ball on 10/26/22 at  8:00 AM EDT by a video enabled telemedicine application and verified that I am speaking with the correct person using two identifiers.  Location: Patient: home Provider: office   I discussed the limitations of evaluation and management by telemedicine and the availability of in person appointments. The patient expressed understanding and agreed to proceed.    I discussed the assessment and treatment plan with the patient. The patient was provided an opportunity to ask questions and all were answered. The patient agreed with the plan and demonstrated an understanding of the instructions.   The patient was advised to call back or seek an in-person evaluation if the symptoms worsen or if the condition fails to improve as anticipated.    [name], MD  [facility] MD/PA/NP OP Progress Note  10/26/22 8:00 AM Anne Ball  MRN:  [id number]  Chief Complaint:    Medication management follow-up for anxiety, depression, ADHD.  HPI: This is a 22 year old African-American female with psychiatric history significant for major depressive disorder, anxiety, ADHD, ASD was seen and evaluated over telemedicine for medication management follow-up.  Part of the appointment was over the telephone because of poor audio connectivity on the video visit.  Anne Ball reports that she has been doing well.  She reports that she has stopped working few weeks ago because of poor working environment and reports that she made this decision with her parents support.  She reports that now she is planning to attend a career College course at [employer] and is looking forward to it.  Additionally she reports that she has been seeing her therapist every week through the study that she is participating in.  She reports that they have been doing a lot of mindfulness activities and after the session she practices them every day.  She reports that this has been  very helpful, especially with her mood and anxiety.  She denies any low lows or depressed mood, denies excessive worries or anxiety, sleeps well, denies problems with appetite.  She states that she needs to do a better job with keeping her room clean.  In her free time she enjoys arts and crafts.  She denies any SI or HI.  In regards to her medication, she reports that she takes her fluoxetine almost daily but forgets to take her Concerta in the morning however has been more consistent since last 2 weeks.  Writer encouraged her to be more adherent to her medications.  Her mother denies any new concerns for today's appointment.  She reports that after quitting the job, she has been more isolative and less motivated but they are working with [facility] to get her into career college course and believes that it will be a good fit as they will teach her subscales as well as specific skills related to specific jobs and need to be every day for 4 days a week which will also allow her to be in good routine.  Mother also expresses concerns regarding her medication admissions, discussed that writer has encouraged patient's to be more adherent and it appears that she has been taking her fluoxetine more regularly.  We discussed to continue with current medications and follow-up again in about 2 months or earlier if needed.  Patient and parent verbalized understanding.   Visit Diagnosis:    ICD-10-CM   1. Other specified anxiety disorders  F41.8 FLUoxetine (PROZAC) 20 MG tablet    2. Recurrent  major depressive disorder, in partial remission (HCC)  F33.41 FLUoxetine (PROZAC) 20 MG tablet    3. Attention deficit hyperactivity disorder (ADHD), predominantly inattentive type  F90.0 methylphenidate (CONCERTA) 54 MG PO CR tablet              Past Psychiatric History: As mentioned in initial H&P, reviewed today, no change. She continues to take Prozac 40 mg once a day, Concerta 54 mg once a day and follow-up with  individual therapist, started following Mr. Bynum Bellows. Past Medical History:  Past Medical History:  Diagnosis Date   Anxiety    Asthma    Depression     Past Surgical History:  Procedure Laterality Date   NO PAST SURGERIES      Family Psychiatric History: As mentioned in initial H&P, reviewed today, no change  Family History:  Family History  Problem Relation Age of Onset   Anxiety disorder Mother    Depression Mother    Diabetes Mother    Hypertension Mother    Hyperlipidemia Mother    Asthma Father    Migraines Neg Hx    Seizures Neg Hx    Autism Neg Hx    ADD / ADHD Neg Hx    Bipolar disorder Neg Hx    Schizophrenia Neg Hx     Social History:  Social History   Socioeconomic History   Marital status: Single    Spouse name: Not on file   Number of children: Not on file   Years of education: Not on file   Highest education level: 10th grade  Occupational History   Not on file  Tobacco Use   Smoking status: Never   Smokeless tobacco: Never  Vaping Use   Vaping Use: Never used  Substance and Sexual Activity   Alcohol use: Never   Drug use: Never   Sexual activity: Never  Other Topics Concern   Not on file  Social History Narrative   Lives with mom, dad and brother. She is in the 12th grade at [employer]. She enjoys eating, sleeping, and drawing.   Social Determinants of Health   Financial Resource Strain: Low Risk  (09/12/2018)   Overall Financial Resource Strain (CARDIA)    Difficulty of Paying Living Expenses: Not hard at all  Food Insecurity: No Food Insecurity (09/12/2018)   Hunger Vital Sign    Worried About Running Out of Food in the Last Year: Never true    Ran Out of Food in the Last Year: Never true  Transportation Needs: No Transportation Needs (09/12/2018)   PRAPARE - Administrator, Civil Service (Medical): No    Lack of Transportation (Non-Medical): No  Physical Activity: Inactive (09/12/2018)   Exercise Vital Sign     Days of Exercise per Week: 0 days    Minutes of Exercise per Session: 0 min  Stress: Stress Concern Present (09/12/2018)   [employer] of Occupational Health - Occupational Stress Questionnaire    Feeling of Stress : Rather much  Social Connections: Unknown (09/12/2018)   Social Connection and Isolation Panel [NHANES]    Frequency of Communication with Friends and Family: Not on file    Frequency of Social Gatherings with Friends and Family: Not on file    Attends Religious Services: 1 to 4 times per year    Active Member of [employer] or Organizations: No    Attends Banker Meetings: Never    Marital Status: Never married    Allergies:  Allergies  Allergen Reactions   Cat Hair Extract    Dog Epithelium (Canis Lupus Familiaris)    Gramineae Pollens    Horse Epithelium     Metabolic Disorder Labs: Lab Results  Component Value Date   HGBA1C 5.7 08/19/2011   No results found for: "PROLACTIN" Lab Results  Component Value Date   CHOL 109 08/19/2011   TRIG 48 08/19/2011   HDL 35 (L) 08/19/2011   VLDL 10 08/19/2011   LDLCALC 64 08/19/2011   Lab Results  Component Value Date   TSH 1.065 12/29/2017    Therapeutic Level Labs: No results found for: "LITHIUM" No results found for: "VALPROATE" No results found for: "CBMZ"  Current Medications: Current Outpatient Medications  Medication Sig Dispense Refill   Albuterol (VENTOLIN IN) Inhale 2 puffs into the lungs every 4 (four) hours as needed (shortness of breath).      Cholecalciferol (VITAMIN D3) 50 MCG (2000 UT) capsule Take 2,000 Units by mouth 2 (two) times daily.     FLUoxetine (PROZAC) 20 MG tablet Take 3 tablets (60 mg total) by mouth daily. 90 tablet 1   fluticasone (FLONASE) 50 MCG/ACT nasal spray Place 2 sprays into both nostrils daily as needed for allergies or rhinitis.  3   metFORMIN (GLUCOPHAGE-XR) 500 MG 24 hr tablet Take 1,000 mg by mouth 2 (two) times daily.     methylphenidate (CONCERTA) 54 MG  PO CR tablet Take 1 tablet (54 mg total) by mouth every morning. 30 tablet 0   methylphenidate (CONCERTA) 54 MG PO CR tablet Take 1 tablet (54 mg total) by mouth every morning. 30 tablet 0   montelukast (SINGULAIR) 10 MG tablet Take 10 mg by mouth at bedtime.     QVAR REDIHALER 80 MCG/ACT inhaler Inhale 2 puffs into the lungs 2 (two) times daily.     No current facility-administered medications for this visit.     Musculoskeletal: Strength & Muscle Tone: WNL Gait & Station: Normal Patient leans: N/A  Psychiatric Specialty Exam: There were no vitals filed for this visit.    There is no height or weight on file to calculate BMI.  Mental Status Exam: Appearance: casually dressed; well groomed; no overt signs of trauma or distress noted Attitude: calm, cooperative with good eye contact Activity: No PMA/PMR, no tics/no tremors; no EPS noted  Speech: normal rate, rhythm and volume Thought Process: Logical, linear, and goal-directed.  Associations: no looseness, tangentiality, circumstantiality, flight of ideas, thought blocking or word salad noted Thought Content: (abnormal/psychotic thoughts): no abnormal or delusional thought process evidenced SI/HI: denies Si/Hi Perception: no illusions or visual/auditory hallucinations noted; no response to internal stimuli demonstrated Mood & Affect: "good"/full range, neutral Judgment & Insight: both fair Attention and Concentration : Good Cognition : WNL Language : Good ADL - Intact       Screenings: GAD-7    Flowsheet Row Office Visit from 01/03/2022 in [facility]  Total GAD-7 Score 17      PHQ2-9    Flowsheet Row Office Visit from 01/03/2022 in [facility] Office Visit from 09/27/2021 in [facility] Office Visit from 08/16/2021 in [facility] Nutrition from 07/01/2021 in  [facility] at [facility] Total Score 5 3 2  0  PHQ-9 Total Score 20 12 12  --      Flowsheet Row Office Visit from 01/03/2022 in [facility]  Office Visit from 09/27/2021 in [facility]  C-SSRS RISK CATEGORY No Risk No Risk        Assessment and Plan:   This is a 22 year old African-American female with psychiatric history significant for major depressive disorder, anxiety, ADHD, ASD.  She appears to have continued stability with her mood and anxiety with therapy and also seems to be more consistent with fluoxetine than Concerta.  She is encouraged to improve the adherence.  They are planning to start career college course which seems a good fit for her to be helpful for her to be in routine and learn skills to get employment in the future.     Plan as below.   #1 Depression (recurrent, in partial remission) - Continue Prozac 60 mg daily and improve compliance  - Continue ind therapy with Mr [facility] - She has supportive parents which is a good prognostic and protective factor.     #2 Anxiety (stable) - Her presentation appears most consistent with generalized anxiety and social anxiety disorders. - Recommending medication and therapy as mentioned above.    #3 ADHD (chronic and stable) -  Continue with Concerta 54 mg once a day   #4 Autism (Chronic, stable) - Testing done at [facility], and diagnosed with ASD per report.  -  Currently working with vocational rehabilitation services to get in career college course.    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 Mother however reports that they have continued with diamox and planning to find a different neuro.  - Defer management to neuro.  - Following with  endo and nutrition service due to obesity and prediabetes.  - On metformin now.    MDM = 2 or more chronic stable conditions + med management           [name], MD       [name], MD 10/26/22, 10:30 am

## 2022-12-29 ENCOUNTER — Telehealth (INDEPENDENT_AMBULATORY_CARE_PROVIDER_SITE_OTHER): Payer: BC Managed Care – PPO | Admitting: Child and Adolescent Psychiatry

## 2022-12-29 ENCOUNTER — Encounter: Payer: Self-pay | Admitting: Child and Adolescent Psychiatry

## 2022-12-29 DIAGNOSIS — F840 Autistic disorder: Secondary | ICD-10-CM | POA: Diagnosis not present

## 2022-12-29 DIAGNOSIS — F900 Attention-deficit hyperactivity disorder, predominantly inattentive type: Secondary | ICD-10-CM | POA: Diagnosis not present

## 2022-12-29 DIAGNOSIS — F3341 Major depressive disorder, recurrent, in partial remission: Secondary | ICD-10-CM | POA: Diagnosis not present

## 2022-12-29 DIAGNOSIS — F418 Other specified anxiety disorders: Secondary | ICD-10-CM | POA: Diagnosis not present

## 2022-12-29 NOTE — Progress Notes (Signed)
Virtual Visit via Video Note  I connected with Anne Ball on 12/29/22 at  9:00 AM EDT by a video enabled telemedicine application and verified that I am speaking with the correct person using two identifiers.  Location: Patient: home Provider: home office   I discussed the limitations of evaluation and management by telemedicine and the availability of in person appointments. The patient expressed understanding and agreed to proceed.    I discussed the assessment and treatment plan with the patient. The patient was provided an opportunity to ask questions and all were answered. The patient agreed with the plan and demonstrated an understanding of the instructions.   The patient was advised to call back or seek an in-person evaluation if the symptoms worsen or if the condition fails to improve as anticipated.    Anne Smalling, MD  [facility] MD/PA/NP OP Progress Note  12/29/22 8:00 AM Anne Ball  MRN:  [id number]  Chief Complaint:   Medication management follow-up for anxiety, depression, ADHD.  HPI: This is a 22 year old African-American female with psychiatric history significant for major depressive disorder, anxiety, ADHD, ASD was seen and evaluated over telemedicine for medication management follow-up.    Anne Ball reports that she has been doing well, has been more active as compared to before.  She is currently attending [facility] cardiac classes 2 days a week, and on her free days, she has been engaging with sewing, stitching, and eating and enjoys these activities.  She reports that her mood has been "pretty good", denies any wallows or depressive episodes recently.  She denies excessive worries or anxiety.  She reports that things are going well along with her parents.  She finished her psychotherapy research program where she was getting weekly CBT sessions for the past 6 months.  She reports that she feels confident using the skills and we will also try to reconnect back with her  previous therapist for intermittent follow-up appointments.  She reports that she has been sleeping fairly well, eating well, denies any SI or HI.  She is still struggling to be compliant with her medications, reports that on some weeks she takes it on every day and on some weeks she forgets to take them.  She reports that her mother continues to remind her to take the medications and she does better when mother does it.  Provided psychoeducation again on medication adherence.  She does report that she feels more focused when she takes her medications.  She denies any concerns for today's appointment.  Her mother also denies any new concerns for today's appointment.  I spoke with mother with patient's verbal informed consent to obtain collateral information and discuss the treatment plan.  Her mother reports that she has been more active, attending classes, keeping her room more cleaner.  We discussed to continue with the current interventions and continue encouraging patients to be more adherent to medications.  Discussed with her that there are still one more refill left on both of her prescriptions from last appointment and have pharmacy,  before the new prescriptions are sent.  She verbalized understanding   Visit Diagnosis:    ICD-10-CM   1. Autism spectrum disorder  F84.0     2. Attention deficit hyperactivity disorder (ADHD), predominantly inattentive type  F90.0     3. Recurrent major depressive disorder, in partial remission (HCC)  F33.41     4. Other specified anxiety disorders  F41.8         Past Psychiatric History: As  mentioned in initial H&P, reviewed today, no change. She continues to take Prozac 40 mg once a day, Concerta 54 mg once a day and follow-up with individual therapist, started following Mr. Bynum Bellows. Past Medical History:  Past Medical History:  Diagnosis Date   Anxiety    Asthma    Depression     Past Surgical History:  Procedure Laterality Date   NO PAST  SURGERIES      Family Psychiatric History: As mentioned in initial H&P, reviewed today, no change  Family History:  Family History  Problem Relation Age of Onset   Anxiety disorder Mother    Depression Mother    Diabetes Mother    Hypertension Mother    Hyperlipidemia Mother    Asthma Father    Migraines Neg Hx    Seizures Neg Hx    Autism Neg Hx    ADD / ADHD Neg Hx    Bipolar disorder Neg Hx    Schizophrenia Neg Hx     Social History:  Social History   Socioeconomic History   Marital status: Single    Spouse name: Not on file   Number of children: Not on file   Years of education: Not on file   Highest education level: 10th grade  Occupational History   Not on file  Tobacco Use   Smoking status: Never   Smokeless tobacco: Never  Vaping Use   Vaping status: Never Used  Substance and Sexual Activity   Alcohol use: Never   Drug use: Never   Sexual activity: Never  Other Topics Concern   Not on file  Social History Narrative   Lives with mom, dad and brother. She is in the 12th grade at [employer]. She enjoys eating, sleeping, and drawing.   Social Determinants of Health   Financial Resource Strain: Low Risk  (09/12/2018)   Overall Financial Resource Strain (CARDIA)    Difficulty of Paying Living Expenses: Not hard at all  Food Insecurity: No Food Insecurity (09/12/2018)   Hunger Vital Sign    Worried About Running Out of Food in the Last Year: Never true    Ran Out of Food in the Last Year: Never true  Transportation Needs: No Transportation Needs (09/12/2018)   PRAPARE - Administrator, Civil Service (Medical): No    Lack of Transportation (Non-Medical): No  Physical Activity: Inactive (09/12/2018)   Exercise Vital Sign    Days of Exercise per Week: 0 days    Minutes of Exercise per Session: 0 min  Stress: Stress Concern Present (09/12/2018)   [employer] of Occupational Health - Occupational Stress Questionnaire    Feeling of Stress  : Rather much  Social Connections: Unknown (09/12/2018)   Social Connection and Isolation Panel [NHANES]    Frequency of Communication with Friends and Family: Not on file    Frequency of Social Gatherings with Friends and Family: Not on file    Attends Religious Services: 1 to 4 times per year    Active Member of [employer] or Organizations: No    Attends Banker Meetings: Never    Marital Status: Never married    Allergies:  Allergies  Allergen Reactions   Cat Hair Extract    Dog Epithelium (Canis Lupus Familiaris)    Gramineae Pollens    Horse Epithelium     Metabolic Disorder Labs: Lab Results  Component Value Date   HGBA1C 5.7 08/19/2011   No results found for: "PROLACTIN" Lab  Results  Component Value Date   CHOL 109 08/19/2011   TRIG 48 08/19/2011   HDL 35 (L) 08/19/2011   VLDL 10 08/19/2011   LDLCALC 64 08/19/2011   Lab Results  Component Value Date   TSH 1.065 12/29/2017    Therapeutic Level Labs: No results found for: "LITHIUM" No results found for: "VALPROATE" No results found for: "CBMZ"  Current Medications: Current Outpatient Medications  Medication Sig Dispense Refill   Albuterol (VENTOLIN IN) Inhale 2 puffs into the lungs every 4 (four) hours as needed (shortness of breath).      Cholecalciferol (VITAMIN D3) 50 MCG (2000 UT) capsule Take 2,000 Units by mouth 2 (two) times daily.     FLUoxetine (PROZAC) 20 MG tablet Take 3 tablets (60 mg total) by mouth daily. 90 tablet 1   fluticasone (FLONASE) 50 MCG/ACT nasal spray Place 2 sprays into both nostrils daily as needed for allergies or rhinitis.  3   metFORMIN (GLUCOPHAGE-XR) 500 MG 24 hr tablet Take 1,000 mg by mouth 2 (two) times daily.     methylphenidate (CONCERTA) 54 MG PO CR tablet Take 1 tablet (54 mg total) by mouth every morning. 30 tablet 0   methylphenidate (CONCERTA) 54 MG PO CR tablet Take 1 tablet (54 mg total) by mouth every morning. 30 tablet 0   montelukast (SINGULAIR) 10 MG  tablet Take 10 mg by mouth at bedtime.     QVAR REDIHALER 80 MCG/ACT inhaler Inhale 2 puffs into the lungs 2 (two) times daily.     No current facility-administered medications for this visit.     Musculoskeletal: Strength & Muscle Tone: unable to assess since visit was over the telemedicine.  Gait & Station: unable to assess since visit was over the telemedicine.  Patient leans: N/A  Psychiatric Specialty Exam: There were no vitals filed for this visit.    There is no height or weight on file to calculate BMI.  Mental Status Exam: Appearance: casually dressed; fairly groomed; no overt signs of trauma or distress noted Attitude: calm, cooperative with fair eye contact Activity: No PMA/PMR, no tics/no tremors; no EPS noted  Speech: normal rate, rhythm and volume Thought Process: Logical, linear, and goal-directed.  Associations: no looseness, tangentiality, circumstantiality, flight of ideas, thought blocking or word salad noted Thought Content: (abnormal/psychotic thoughts): no abnormal or delusional thought process evidenced SI/HI: denies Si/Hi Perception: no illusions or visual/auditory hallucinations noted; no response to internal stimuli demonstrated Mood & Affect: "good"/restricted Judgment & Insight: both fair Attention and Concentration : Good Cognition : WNL Language : Good ADL - Intact       Screenings: GAD-7    Flowsheet Row Office Visit from 01/03/2022 in [facility]  Total GAD-7 Score 17      PHQ2-9    Flowsheet Row Office Visit from 01/03/2022 in [facility] Office Visit from 09/27/2021 in [facility] Office Visit from 08/16/2021 in [facility] Nutrition from 07/01/2021 in [facility] at [facility] Total Score 5 3 2  0  PHQ-9 Total Score 20 12 12   --      Flowsheet Row Office Visit from 01/03/2022 in [facility] Office Visit from 09/27/2021 in [facility]  C-SSRS RISK CATEGORY No Risk No Risk        Assessment and Plan:   This is a 22 year old  African-American female with psychiatric history significant for major depressive disorder, anxiety, ADHD, ASD. Appears to have continued stability with mood and anxiety, despite partial compliance to medications. She is currently in career college program through [employer] which seems to be helpful. Completed weekly CBT recently after doing it for 6 months and feels comfortable using the skills she learnt to manage her anxiety and stressors.     Plan as below.   #1 Depression (recurrent, in partial remission) - Continue Prozac 60 mg daily and improve compliance  - Recommended to restart ind therapy with Mr Sheets as she completed weekly CBT with [facility] - She has supportive parents which is a good prognostic and protective factor.     #2 Anxiety (stable) - Her presentation appears most consistent with generalized anxiety and social anxiety disorders. - Recommending medication and therapy as mentioned above.    #3 ADHD (chronic and stable) -  Continue with Concerta 54 mg once a day   #4 Autism (Chronic, stable) - Testing done at [facility], and diagnosed with ASD per report.  -  Currently in career college course at [employer].    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 Mother however reports that they have continued with diamox and planning to find a different neuro.  - Defer management to neuro.  - Following with endo and nutrition service due to obesity and prediabetes.  - On metformin now.           Anne Smalling, MD 12/29/22, 10:30 am

## 2023-03-02 ENCOUNTER — Telehealth: Payer: BC Managed Care – PPO | Admitting: Child and Adolescent Psychiatry

## 2023-03-02 DIAGNOSIS — F900 Attention-deficit hyperactivity disorder, predominantly inattentive type: Secondary | ICD-10-CM | POA: Diagnosis not present

## 2023-03-02 DIAGNOSIS — F840 Autistic disorder: Secondary | ICD-10-CM

## 2023-03-02 DIAGNOSIS — F3341 Major depressive disorder, recurrent, in partial remission: Secondary | ICD-10-CM

## 2023-03-02 NOTE — Progress Notes (Signed)
Virtual Visit via Video Note  I connected with Anne Ball on 03/02/23 at  9:00 AM EDT by a video enabled telemedicine application and verified that I am speaking with the correct person using two identifiers.  Location: Patient: home Provider: home office   I discussed the limitations of evaluation and management by telemedicine and the availability of in person appointments. The patient expressed understanding and agreed to proceed.    I discussed the assessment and treatment plan with the patient. The patient was provided an opportunity to ask questions and all were answered. The patient agreed with the plan and demonstrated an understanding of the instructions.   The patient was advised to call back or seek an in-person evaluation if the symptoms worsen or if the condition fails to improve as anticipated.    Anne Smalling, MD  [facility] MD/PA/NP OP Progress Note  03/02/23 9:00 AM Anne Ball  MRN:  [id number]  Chief Complaint:   Medication management follow-up for anxiety, depression, ADHD.  HPI: This is a 22 year old African-American female with psychiatric history significant for major depressive disorder, anxiety, ADHD, ASD was seen and evaluated over telemedicine for medication management follow-up.    Anne Ball reported that she has been doing "pretty good".  She reported that she has continued to go to [employer] 2 days a week for her GED classes.  She reported that that has been going well and she enjoys being at school.  She reported that when she is at home, she spends about 30 minutes doing her homework, plays with her dogs and then plays video games.  She reported that she has been eating well, goes to sleep around 8-10 pm.  She denies problems with appetite.  She denied SI or HI.  She denied excessive worries or anxiety, reported that classes have been very straightforward therefore she has not been getting anxious.  In regards to her medications, she reported that she continues  to struggle with not sure he reported that she takes about 2 days a week the mother reported that since last week she has only taken once her medications.  Psychoeducation was provided regarding medications.  She denied any problems with taking the medications however reported that she forgets to take them.  We discussed the barriers and possible solutions to improve medication adherence.  She was receptive to this.  Her mother reported patient continues to take her medications.  We discussed to put only fluoxetine 20 mg in her daily medication planner, as she has not been taking fluoxetine consistently.  Mother verbalized understanding.  Mother reported that she continues to attend classes, they feel that patient is ready to take at least 3 tests out of 5 however they are waiting for Anne Ball to feel ready about taking them.  I discussed to continue to encourage her to continue with school and medication adherence.  They will follow-up again in about 2 months or earlier if needed.   Visit Diagnosis:    ICD-10-CM   1. Autism spectrum disorder  F84.0     2. Attention deficit hyperactivity disorder (ADHD), predominantly inattentive type  F90.0     3. Recurrent major depressive disorder, in partial remission (HCC)  F33.41          Past Psychiatric History: As mentioned in initial H&P, reviewed today, no change. She continues to take Prozac 40 mg once a day, Concerta 54 mg once a day and follow-up with individual therapist, started following Mr. Bynum Bellows. Past Medical History:  Past Medical History:  Diagnosis Date   Anxiety    Asthma    Depression     Past Surgical History:  Procedure Laterality Date   NO PAST SURGERIES      Family Psychiatric History: As mentioned in initial H&P, reviewed today, no change  Family History:  Family History  Problem Relation Age of Onset   Anxiety disorder Mother    Depression Mother    Diabetes Mother    Hypertension Mother    Hyperlipidemia  Mother    Asthma Father    Migraines Neg Hx    Seizures Neg Hx    Autism Neg Hx    ADD / ADHD Neg Hx    Bipolar disorder Neg Hx    Schizophrenia Neg Hx     Social History:  Social History   Socioeconomic History   Marital status: Single    Spouse name: Not on file   Number of children: Not on file   Years of education: Not on file   Highest education level: 10th grade  Occupational History   Not on file  Tobacco Use   Smoking status: Never   Smokeless tobacco: Never  Vaping Use   Vaping status: Never Used  Substance and Sexual Activity   Alcohol use: Never   Drug use: Never   Sexual activity: Never  Other Topics Concern   Not on file  Social History Narrative   Lives with mom, dad and brother. She is in the 12th grade at [employer]. She enjoys eating, sleeping, and drawing.   Social Determinants of Health   Financial Resource Strain: Low Risk  (09/12/2018)   Overall Financial Resource Strain (CARDIA)    Difficulty of Paying Living Expenses: Not hard at all  Food Insecurity: No Food Insecurity (09/12/2018)   Hunger Vital Sign    Worried About Running Out of Food in the Last Year: Never true    Ran Out of Food in the Last Year: Never true  Transportation Needs: No Transportation Needs (09/12/2018)   PRAPARE - Administrator, Civil Service (Medical): No    Lack of Transportation (Non-Medical): No  Physical Activity: Inactive (09/12/2018)   Exercise Vital Sign    Days of Exercise per Week: 0 days    Minutes of Exercise per Session: 0 min  Stress: Stress Concern Present (09/12/2018)   [facility] [employer] - Occupational Stress Questionnaire    Feeling of Stress : Rather much  Social Connections: Unknown (09/12/2018)   Social Connection and Isolation Panel [NHANES]    Frequency of Communication with Friends and Family: Not on file    Frequency of Social Gatherings with Friends and Family: Not on file    Attends Religious Services: 1  to 4 times per year    Active Member of [employer] or Organizations: No    Attends Banker Meetings: Never    Marital Status: Never married    Allergies:  Allergies  Allergen Reactions   Cat Hair Extract    Dog Epithelium (Canis Lupus Familiaris)    Gramineae Pollens    Horse Epithelium     Metabolic Disorder Labs: Lab Results  Component Value Date   HGBA1C 5.7 08/19/2011   No results found for: "PROLACTIN" Lab Results  Component Value Date   CHOL 109 08/19/2011   TRIG 48 08/19/2011   HDL 35 (L) 08/19/2011   VLDL 10 08/19/2011   LDLCALC 64 08/19/2011   Lab Results  Component Value  Date   TSH 1.065 12/29/2017    Therapeutic Level Labs: No results found for: "LITHIUM" No results found for: "VALPROATE" No results found for: "CBMZ"  Current Medications: Current Outpatient Medications  Medication Sig Dispense Refill   Albuterol (VENTOLIN IN) Inhale 2 puffs into the lungs every 4 (four) hours as needed (shortness of breath).      Cholecalciferol (VITAMIN D3) 50 MCG (2000 UT) capsule Take 2,000 Units by mouth 2 (two) times daily.     FLUoxetine (PROZAC) 20 MG tablet Take 3 tablets (60 mg total) by mouth daily. 90 tablet 1   fluticasone (FLONASE) 50 MCG/ACT nasal spray Place 2 sprays into both nostrils daily as needed for allergies or rhinitis.  3   metFORMIN (GLUCOPHAGE-XR) 500 MG 24 hr tablet Take 1,000 mg by mouth 2 (two) times daily.     methylphenidate (CONCERTA) 54 MG PO CR tablet Take 1 tablet (54 mg total) by mouth every morning. 30 tablet 0   methylphenidate (CONCERTA) 54 MG PO CR tablet Take 1 tablet (54 mg total) by mouth every morning. 30 tablet 0   montelukast (SINGULAIR) 10 MG tablet Take 10 mg by mouth at bedtime.     QVAR REDIHALER 80 MCG/ACT inhaler Inhale 2 puffs into the lungs 2 (two) times daily.     No current facility-administered medications for this visit.     Musculoskeletal: Strength & Muscle Tone: unable to assess since visit was  over the telemedicine.  Gait & Station: unable to assess since visit was over the telemedicine.  Patient leans: N/A  Psychiatric Specialty Exam: There were no vitals filed for this visit.    There is no height or weight on file to calculate BMI.  Mental Status Exam: Appearance: casually dressed; fairly groomed; no overt signs of trauma or distress noted Attitude: calm, cooperative with fair contact Activity: No PMA/PMR, no tics/no tremors; no EPS noted  Speech: normal rate, rhythm and volume Thought Process: Logical, linear, and goal-directed.  Associations: no looseness, tangentiality, circumstantiality, flight of ideas, thought blocking or word salad noted Thought Content: (abnormal/psychotic thoughts): no abnormal or delusional thought process evidenced SI/HI: denies Si/Hi Perception: no illusions or visual/auditory hallucinations noted; no response to internal stimuli demonstrated Mood & Affect: "good"/restricted Judgment & Insight: both fair Attention and Concentration : Good Cognition : WNL Language : Good ADL - Intact       Screenings: GAD-7    Flowsheet Row Office Visit from 01/03/2022 in [facility]  Total GAD-7 Score 17      PHQ2-9    Flowsheet Row Office Visit from 01/03/2022 in [facility] Office Visit from 09/27/2021 in [facility] Office Visit from 08/16/2021 in [facility] Nutrition from 07/01/2021 in [facility] at [facility] Total Score 5 3 2  0  PHQ-9 Total Score 20 12 12  --      Flowsheet Row Office Visit from 01/03/2022 in [facility] Office Visit from 09/27/2021 in [facility]  C-SSRS RISK CATEGORY No Risk No Risk        Assessment and Plan:   This is a  22 year old African-American female with psychiatric history significant for major depressive disorder, anxiety, ADHD, ASD. Appears to have continued stability with mood and anxiety, despite partial compliance to medications. She is currently in career college program through [employer] which seems to  be helpful. Completed weekly CBT recently after doing it for 6 months and feels comfortable using the skills she learnt to manage her anxiety and stressors.  She reported remission of depressive symptoms and stability with anxiety despite not being on medications.  She still seems to struggle with motivation, self-confidence spends majority of the time at home.  Recommending to restart fluoxetine increased to 20 mg daily, while improving adherence to Concerta.   Plan as below.   #1 Depression (recurrent, in partial remission) -Take Prozac 20 mg daily and improve compliance  - Recommended to restart ind therapy with Mr Sheets as she completed weekly CBT with [facility] - She has supportive parents which is a good prognostic and protective factor.     #2 Anxiety (stable) - Her presentation appears most consistent with generalized anxiety and social anxiety disorders. - Recommending medication and therapy as mentioned above.    #3 ADHD (chronic and stable) -  Continue with Concerta 54 mg once a day   #4 Autism (Chronic, stable) - Testing done at [facility], and diagnosed with ASD per report.  -  Currently in career college course at [employer].    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 Mother however reports that they have continued with diamox and planning to find a different neuro.  - Defer management to neuro.  - Following with endo and nutrition service due to obesity and prediabetes.  - On metformin now however also does not seem to be adherent to it.             Anne Smalling, MD 03/02/23, 10:30 am

## 2023-05-03 ENCOUNTER — Telehealth (INDEPENDENT_AMBULATORY_CARE_PROVIDER_SITE_OTHER): Payer: BC Managed Care – PPO | Admitting: Child and Adolescent Psychiatry

## 2023-05-03 DIAGNOSIS — F418 Other specified anxiety disorders: Secondary | ICD-10-CM | POA: Diagnosis not present

## 2023-05-03 DIAGNOSIS — F3341 Major depressive disorder, recurrent, in partial remission: Secondary | ICD-10-CM

## 2023-05-03 NOTE — Progress Notes (Signed)
Virtual Visit via Video Note  I connected with Anne Ball on 05/03/23 at  9:00 AM EST by a video enabled telemedicine application and verified that I am speaking with the correct person using two identifiers.  Location: Patient: home Provider: home office   I discussed the limitations of evaluation and management by telemedicine and the availability of in person appointments. The patient expressed understanding and agreed to proceed.    I discussed the assessment and treatment plan with the patient. The patient was provided an opportunity to ask questions and all were answered. The patient agreed with the plan and demonstrated an understanding of the instructions.   The patient was advised to call back or seek an in-person evaluation if the symptoms worsen or if the condition fails to improve as anticipated.    Anne Smalling, MD  [facility] MD/PA/NP OP Progress Note  05/03/23 9:00 AM Anne Ball  MRN:  [id number]  Chief Complaint:   Medication management follow-up for anxiety, depression and ADHD.  HPI: This is a 22 year old African-American female with psychiatric history significant for major depressive disorder, anxiety, ADHD, ASD was seen and evaluated over telemedicine for medication management follow-up.    Old reported that she has been doing "good", her mood has been "good", denies any low lows or depressed mood, reported that she has been going to [employer] for GED classes 2 days a week and that has been going well for her.  She also has some online classes to attend on the days she does not go in person.  She is doing well in regards of her academics so far and has to take a couple of exams before she could pass GED.  She denied excessive worries or anxiety when she is at school or at home.  She has been spending time helping his brother recently, doing some woodwork, and enjoys his activities.  She denied any SI or HI.  She reported that she is still taking her medications only 2  or 3 days a week, and reported that she needs to be more consistent with it.  Psychoeducation and encouragement was provided to improve the adherence to the medications.  She was receptive to this.  She reported that her mother is at her brother's surgery and is not able to speak today.  She is scheduled follow-up for herself in 2 months.     Visit Diagnosis:    ICD-10-CM   1. Other specified anxiety disorders  F41.8     2. Recurrent major depressive disorder, in partial remission (HCC)  F33.41           Past Psychiatric History: As mentioned in initial H&P, reviewed today, no change. She continues to take Prozac 40 mg once a day, Concerta 54 mg once a day and follow-up with individual therapist, started following Anne Ball. Past Medical History:  Past Medical History:  Diagnosis Date   Anxiety    Asthma    Depression     Past Surgical History:  Procedure Laterality Date   NO PAST SURGERIES      Family Psychiatric History: As mentioned in initial H&P, reviewed today, no change  Family History:  Family History  Problem Relation Age of Onset   Anxiety disorder Mother    Depression Mother    Diabetes Mother    Hypertension Mother    Hyperlipidemia Mother    Asthma Father    Migraines Neg Hx    Seizures Neg Hx    Autism  Neg Hx    ADD / ADHD Neg Hx    Bipolar disorder Neg Hx    Schizophrenia Neg Hx     Social History:  Social History   Socioeconomic History   Marital status: Single    Spouse name: Not on file   Number of children: Not on file   Years of education: Not on file   Highest education level: 10th grade  Occupational History   Not on file  Tobacco Use   Smoking status: Never   Smokeless tobacco: Never  Vaping Use   Vaping status: Never Used  Substance and Sexual Activity   Alcohol use: Never   Drug use: Never   Sexual activity: Never  Other Topics Concern   Not on file  Social History Narrative   Lives with mom, dad and brother. She  is in the 12th grade at [employer]. She enjoys eating, sleeping, and drawing.   Social Determinants of Health   Financial Resource Strain: Low Risk  (09/12/2018)   Overall Financial Resource Strain (CARDIA)    Difficulty of Paying Living Expenses: Not hard at all  Food Insecurity: No Food Insecurity (09/12/2018)   Hunger Vital Sign    Worried About Running Out of Food in the Last Year: Never true    Ran Out of Food in the Last Year: Never true  Transportation Needs: No Transportation Needs (09/12/2018)   PRAPARE - Administrator, Civil Service (Medical): No    Lack of Transportation (Non-Medical): No  Physical Activity: Inactive (09/12/2018)   Exercise Vital Sign    Days of Exercise per Week: 0 days    Minutes of Exercise per Session: 0 min  Stress: Stress Concern Present (09/12/2018)   [facility] [employer] - Occupational Stress Questionnaire    Feeling of Stress : Rather much  Social Connections: Unknown (09/12/2018)   Social Connection and Isolation Panel [NHANES]    Frequency of Communication with Friends and Family: Not on file    Frequency of Social Gatherings with Friends and Family: Not on file    Attends Religious Services: 1 to 4 times per year    Active Member of [employer] or Organizations: No    Attends Banker Meetings: Never    Marital Status: Never married    Allergies:  Allergies  Allergen Reactions   Cat Hair Extract    Dog Epithelium (Canis Lupus Familiaris)    Gramineae Pollens    Horse Epithelium     Metabolic Disorder Labs: Lab Results  Component Value Date   HGBA1C 5.7 08/19/2011   No results found for: "PROLACTIN" Lab Results  Component Value Date   CHOL 109 08/19/2011   TRIG 48 08/19/2011   HDL 35 (L) 08/19/2011   VLDL 10 08/19/2011   LDLCALC 64 08/19/2011   Lab Results  Component Value Date   TSH 1.065 12/29/2017    Therapeutic Level Labs: No results found for: "LITHIUM" No results found  for: "VALPROATE" No results found for: "CBMZ"  Current Medications: Current Outpatient Medications  Medication Sig Dispense Refill   Albuterol (VENTOLIN IN) Inhale 2 puffs into the lungs every 4 (four) hours as needed (shortness of breath).      Cholecalciferol (VITAMIN D3) 50 MCG (2000 UT) capsule Take 2,000 Units by mouth 2 (two) times daily.     FLUoxetine (PROZAC) 20 MG tablet Take 3 tablets (60 mg total) by mouth daily. 90 tablet 1   fluticasone (FLONASE) 50 MCG/ACT nasal  spray Place 2 sprays into both nostrils daily as needed for allergies or rhinitis.  3   metFORMIN (GLUCOPHAGE-XR) 500 MG 24 hr tablet Take 1,000 mg by mouth 2 (two) times daily.     methylphenidate (CONCERTA) 54 MG PO CR tablet Take 1 tablet (54 mg total) by mouth every morning. 30 tablet 0   methylphenidate (CONCERTA) 54 MG PO CR tablet Take 1 tablet (54 mg total) by mouth every morning. 30 tablet 0   montelukast (SINGULAIR) 10 MG tablet Take 10 mg by mouth at bedtime.     QVAR REDIHALER 80 MCG/ACT inhaler Inhale 2 puffs into the lungs 2 (two) times daily.     No current facility-administered medications for this visit.     Musculoskeletal: Strength & Muscle Tone: unable to assess since visit was over the telemedicine.  Gait & Station: unable to assess since visit was over the telemedicine.  Patient leans: N/A  Psychiatric Specialty Exam: There were no vitals filed for this visit.    There is no height or weight on file to calculate BMI.  Mental Status Exam: Appearance: casually dressed; fairly groomed; no overt signs of trauma or distress noted Attitude: calm, cooperative with good eye contact Activity: No PMA/PMR, no tics/no tremors; no EPS noted  Speech: normal rate, rhythm and volume Thought Process: Logical, linear, and goal-directed.  Associations: no looseness, tangentiality, circumstantiality, flight of ideas, thought blocking or word salad noted Thought Content: (abnormal/psychotic thoughts): no  abnormal or delusional thought process evidenced SI/HI: denies Si/Hi Perception: no illusions or visual/auditory hallucinations noted; no response to internal stimuli demonstrated Mood & Affect: "good"/full range Judgment & Insight: both fair Attention and Concentration : Good Cognition : WNL Language : Good ADL - Intact       Screenings: GAD-7    Flowsheet Row Office Visit from 01/03/2022 in [facility]  Total GAD-7 Score 17      PHQ2-9    Flowsheet Row Office Visit from 01/03/2022 in [facility] Office Visit from 09/27/2021 in [facility] Office Visit from 08/16/2021 in [facility] Nutrition from 07/01/2021 in [facility] Diabetes Education Services at [facility] Total Score 5 3 2  0  PHQ-9 Total Score 20 12 12  --      Flowsheet Row Office Visit from 01/03/2022 in [facility] Office Visit from 09/27/2021 in [facility]  C-SSRS RISK CATEGORY No Risk No Risk        Assessment and Plan:   This is a 22 year old African-American female with psychiatric history significant for major depressive disorder, anxiety, ADHD, ASD.  She appears to have continued stability with mood and anxiety despite partial compliance to medications.  She continues to attend career college program through [employer] which seems to be helpful. Completed weekly CBT recently after doing it for 6 months and feels comfortable using the skills she learnt to manage her anxiety and stressors.  Recommending to improve adherence to her medications.    Plan as below.   #1 Depression (recurrent, in partial remission) -Take Prozac 20 mg daily and improve compliance  - Recommended to restart ind therapy with Mr Sheets as she completed weekly CBT with [facility] - She  has supportive parents which is a good prognostic and protective factor.     #2 Anxiety (stable) - Her presentation appears most consistent with generalized anxiety and  social anxiety disorders. - Recommending medication and therapy as mentioned above.    #3 ADHD (chronic and stable) -  Continue with Concerta 54 mg once a day   #4 Autism (Chronic, stable) - Testing done at [facility], and diagnosed with ASD per report.  -  Currently in career college course at [employer].    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 Mother however reports that they have continued with diamox and planning to find a different neuro.  - Defer management to neuro.  - Following with endo and nutrition service due to obesity and prediabetes.  - On metformin now however also does not seem to be adherent to it.            Anne Smalling, MD 05/03/23, 9:00 am

## 2023-07-10 ENCOUNTER — Telehealth (INDEPENDENT_AMBULATORY_CARE_PROVIDER_SITE_OTHER): Payer: BC Managed Care – PPO | Admitting: Child and Adolescent Psychiatry

## 2023-07-10 DIAGNOSIS — F418 Other specified anxiety disorders: Secondary | ICD-10-CM | POA: Diagnosis not present

## 2023-07-10 DIAGNOSIS — F3341 Major depressive disorder, recurrent, in partial remission: Secondary | ICD-10-CM | POA: Diagnosis not present

## 2023-07-10 DIAGNOSIS — F900 Attention-deficit hyperactivity disorder, predominantly inattentive type: Secondary | ICD-10-CM | POA: Diagnosis not present

## 2023-07-10 DIAGNOSIS — F840 Autistic disorder: Secondary | ICD-10-CM

## 2023-07-10 NOTE — Progress Notes (Signed)
Virtual Visit via Video Note  I connected with Anne Ball on 07/10/23 at  9:00 AM EST by a video enabled telemedicine application and verified that I am speaking with the correct person using two identifiers.  Location: Patient: home Provider: home office   I discussed the limitations of evaluation and management by telemedicine and the availability of in person appointments. The patient expressed understanding and agreed to proceed.    I discussed the assessment and treatment plan with the patient. The patient was provided an opportunity to ask questions and all were answered. The patient agreed with the plan and demonstrated an understanding of the instructions.   The patient was advised to call back or seek an in-person evaluation if the symptoms worsen or if the condition fails to improve as anticipated.    Anne Smalling, MD  [facility] MD/PA/NP OP Progress Note  07/10/23 9:00 AM Anne Ball  MRN:  [id number]  Chief Complaint:  Medication management follow-up for anxiety, depression and ADHD.  HPI: This is a 23 year old African-American female with psychiatric history significant for major depressive disorder, anxiety, ADHD, ASD was seen and evaluated over telemedicine for medication management follow-up.    Anne Ball reported that she has been sick with respiratory viral infection with past couple of weeks, recently discovered that she has flu and being treated for it.  She reported that she has noticed improvement with her symptoms.    In regards of her mood, she reported that she has been doing "pretty good", denied excessive worries or anxiety, reported that she has been working on taking GED tests and planning to attend [employer] classes after she finishes GED.  She reported that at home recently she has been spending time mostly watching TV, playing with her animals, sometimes goes out with her parents.  She reported that things are going well with her parents.  She reported that she  is also taking care of herself.  She reported sleeping about 6 to 7 hours at night, denied problems with her except recently because of being sick.  She denied problems with appetite.  She denied SI or HI.  She reported that she is still not taking her medications consistently.  We discussed whether we should continue the medications since she is reporting that she is doing well despite not taking her medications.  She however would like to continue having these medications prescribed and reported that she is planning to be more consistent with her medications.  She reported that she still has enough supply at home which she will finish before she will require any more prescriptions.  I provided psychoeducation on medications and importance of taking them consistently.  She verbalized understanding.  We discussed to take only fluoxetine 20 mg daily and Concerta 54 mg daily.  She verbalized understanding.  She provided verbal informed consent to speak with her mother, to obtain collateral information and discuss the treatment plan.  Her mother denied any new concerns for today's appointment.  She reported that patient is still not taking her medications and she had discussions with her about this.  She reported that patient at home, spends majority of the time in her room, continues to have struggle organizing her room as well as cleaning it, goes out with them on the weekends when they go out to eat, and requires a lot of help with organization.  She prefers if patient takes 2 medications.  I discussed with mother regarding discussions with patient's on medications.  Discussed with  patient is agreeable to restart taking the medications.  She agreed to work with patient on this.  We also discussed other community resources which might allow patient to be more active. Discussed with mother to reach out to [facility] and inquire about their resources. She verbalized understanding.   Visit  Diagnosis:    ICD-10-CM   1. Attention deficit hyperactivity disorder (ADHD), predominantly inattentive type  F90.0     2. Other specified anxiety disorders  F41.8     3. Recurrent major depressive disorder, in partial remission (HCC)  F33.41     4. Autism spectrum disorder  F84.0            Past Psychiatric History: As mentioned in initial H&P, reviewed today, no change. She continues to take Prozac 40 mg once a day, Concerta 54 mg once a day and follow-up with individual therapist, started following Mr. Bynum Bellows. Past Medical History:  Past Medical History:  Diagnosis Date   Anxiety    Asthma    Depression     Past Surgical History:  Procedure Laterality Date   NO PAST SURGERIES      Family Psychiatric History: As mentioned in initial H&P, reviewed today, no change  Family History:  Family History  Problem Relation Age of Onset   Anxiety disorder Mother    Depression Mother    Diabetes Mother    Hypertension Mother    Hyperlipidemia Mother    Asthma Father    Migraines Neg Hx    Seizures Neg Hx    Autism Neg Hx    ADD / ADHD Neg Hx    Bipolar disorder Neg Hx    Schizophrenia Neg Hx     Social History:  Social History   Socioeconomic History   Marital status: Single    Spouse name: Not on file   Number of children: Not on file   Years of education: Not on file   Highest education level: 10th grade  Occupational History   Not on file  Tobacco Use   Smoking status: Never   Smokeless tobacco: Never  Vaping Use   Vaping status: Never Used  Substance and Sexual Activity   Alcohol use: Never   Drug use: Never   Sexual activity: Never  Other Topics Concern   Not on file  Social History Narrative   Lives with mom, dad and brother. She is in the 12th grade at [employer]. She enjoys eating, sleeping, and drawing.   Social Drivers of Corporate investment banker Strain: Low Risk  (07/03/2023)   Received from [facility]    Overall Financial Resource Strain (CARDIA)    Difficulty of Paying Living Expenses: Not very hard  Food Insecurity: No Food Insecurity (07/03/2023)   [facility]   Hunger Vital Sign    Worried About Running Out of Food in the Last Year: Never true    Ran Out of Food in the Last Year: Never true  Transportation Needs: No Transportation Needs (07/03/2023)   Received from [facility] - Transportation    In the past 12 months, has lack of transportation kept you from medical appointments or from getting medications?: No    Lack of Transportation (Non-Medical): No  Physical Activity: Inactive (09/12/2018)   Exercise Vital Sign    Days of Exercise per Week: 0 days    Minutes of Exercise per Session: 0 min  Stress: Stress  Concern Present (09/12/2018)   [employer] of Occupational Health - Occupational Stress Questionnaire    Feeling of Stress : Rather much  Social Connections: Unknown (09/12/2018)   Social Connection and Isolation Panel [NHANES]    Frequency of Communication with Friends and Family: Not on file    Frequency of Social Gatherings with Friends and Family: Not on file    Attends Religious Services: 1 to 4 times per year    Active Member of [employer] or Organizations: No    Attends Banker Meetings: Never    Marital Status: Never married    Allergies:  Allergies  Allergen Reactions   Cat Dander    Dog Epithelium (Canis Lupus Familiaris)    Gramineae Pollens    Horse Epithelium     Metabolic Disorder Labs: Lab Results  Component Value Date   HGBA1C 5.7 08/19/2011   No results found for: "PROLACTIN" Lab Results  Component Value Date   CHOL 109 08/19/2011   TRIG 48 08/19/2011   HDL 35 (L) 08/19/2011   VLDL 10 08/19/2011   LDLCALC 64 08/19/2011   Lab Results  Component Value Date   TSH 1.065 12/29/2017    Therapeutic Level Labs: No results found for: "LITHIUM" No results found for:  "VALPROATE" No results found for: "CBMZ"  Current Medications: Current Outpatient Medications  Medication Sig Dispense Refill   Albuterol (VENTOLIN IN) Inhale 2 puffs into the lungs every 4 (four) hours as needed (shortness of breath).      Cholecalciferol (VITAMIN D3) 50 MCG (2000 UT) capsule Take 2,000 Units by mouth 2 (two) times daily.     FLUoxetine (PROZAC) 20 MG tablet Take 3 tablets (60 mg total) by mouth daily. 90 tablet 1   fluticasone (FLONASE) 50 MCG/ACT nasal spray Place 2 sprays into both nostrils daily as needed for allergies or rhinitis.  3   metFORMIN (GLUCOPHAGE-XR) 500 MG 24 hr tablet Take 1,000 mg by mouth 2 (two) times daily.     methylphenidate (CONCERTA) 54 MG PO CR tablet Take 1 tablet (54 mg total) by mouth every morning. 30 tablet 0   methylphenidate (CONCERTA) 54 MG PO CR tablet Take 1 tablet (54 mg total) by mouth every morning. 30 tablet 0   montelukast (SINGULAIR) 10 MG tablet Take 10 mg by mouth at bedtime.     QVAR REDIHALER 80 MCG/ACT inhaler Inhale 2 puffs into the lungs 2 (two) times daily.     No current facility-administered medications for this visit.     Musculoskeletal: Strength & Muscle Tone: unable to assess since visit was over the telemedicine.  Gait & Station: unable to assess since visit was over the telemedicine.  Patient leans: N/A  Psychiatric Specialty Exam: There were no vitals filed for this visit.    There is no height or weight on file to calculate BMI.  Mental Status Exam: Appearance: casually dressed; well groomed; no overt signs of trauma or distress noted Attitude: calm, cooperative with good eye contact Activity: No PMA/PMR, no tics/no tremors; no EPS noted  Speech: normal rate, rhythm and volume Thought Process: Logical, linear, and goal-directed.  Associations: no looseness, tangentiality, circumstantiality, flight of ideas, thought blocking or word salad noted Thought Content: (abnormal/psychotic thoughts): no  abnormal or delusional thought process evidenced SI/HI: denies Si/Hi Perception: no illusions or visual/auditory hallucinations noted; no response to internal stimuli demonstrated Mood & Affect: "good"/restricted Judgment & Insight: both fair Attention and Concentration : Good Cognition : WNL Language : Good ADL -  Intact       Screenings: GAD-7    Flowsheet Row Office Visit from 01/03/2022 in [facility]  Total GAD-7 Score 17      PHQ2-9    Flowsheet Row Office Visit from 01/03/2022 in [facility] Office Visit from 09/27/2021 in [facility] Office Visit from 08/16/2021 in [facility] Nutrition from 07/01/2021 in [facility] at [facility] Total Score 5 3 2  0  PHQ-9 Total Score 20 12 12  --      Flowsheet Row Office Visit from 01/03/2022 in [facility] Office Visit from 09/27/2021 in [facility]  C-SSRS RISK CATEGORY No Risk No Risk        Assessment and Plan:   This is a 23 year old African-American female with psychiatric history significant for major depressive disorder, anxiety, ADHD, ASD.  She appears to have continued stability with mood and anxiety despite non compliance to medications. She appears to be at her psychiatric baseline. 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 Discussed with pt regarding options of discontinuing vs continuing medications and she would like to continue taking them and agrees to restart.  Recommending to improve adherence to her medications.   Plan as below.  #1 Depression (recurrent, in partial remission) -Take  Prozac 20 mg daily and improve compliance  - She has supportive parents which is a good prognostic and protective factor.     #2 Anxiety (stable) - Her presentation appears most consistent with generalized anxiety and social anxiety disorders. - Recommending medication and therapy as mentioned above.    #3 ADHD (chronic and stable) -  Continue with Concerta 54 mg once a day   #4 Autism (Chronic, stable) - Testing done at [facility], and diagnosed with ASD per report.  -  Currently in career college course at [employer].    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 Mother however reports that they have continued with diamox and planning to find a different neuro.  - Defer management to neuro.  - Following with endo and nutrition service due to obesity and prediabetes.  - On metformin now however also does not seem to be adherent to it.            Anne Smalling, MD 07/10/23, 9:00 am

## 2023-07-13 ENCOUNTER — Emergency Department: Payer: BC Managed Care – PPO

## 2023-07-13 ENCOUNTER — Telehealth (INDEPENDENT_AMBULATORY_CARE_PROVIDER_SITE_OTHER): Payer: Self-pay | Admitting: Neurology

## 2023-07-13 ENCOUNTER — Other Ambulatory Visit: Payer: Self-pay

## 2023-07-13 ENCOUNTER — Inpatient Hospital Stay
Admission: EM | Admit: 2023-07-13 | Discharge: 2023-07-16 | DRG: 092 | Disposition: A | Discharge status: Home / Self Care | Payer: BC Managed Care – PPO | Attending: Internal Medicine | Admitting: Internal Medicine

## 2023-07-13 DIAGNOSIS — F840 Autistic disorder: Secondary | ICD-10-CM | POA: Diagnosis present

## 2023-07-13 DIAGNOSIS — E66813 Obesity, class 3: Secondary | ICD-10-CM | POA: Diagnosis present

## 2023-07-13 DIAGNOSIS — Z818 Family history of other mental and behavioral disorders: Secondary | ICD-10-CM

## 2023-07-13 DIAGNOSIS — H4711 Papilledema associated with increased intracranial pressure: Secondary | ICD-10-CM | POA: Diagnosis present

## 2023-07-13 DIAGNOSIS — Z6841 Body Mass Index (BMI) 40.0 and over, adult: Secondary | ICD-10-CM

## 2023-07-13 DIAGNOSIS — G932 Benign intracranial hypertension: Secondary | ICD-10-CM | POA: Diagnosis present

## 2023-07-13 DIAGNOSIS — Z79899 Other long term (current) drug therapy: Secondary | ICD-10-CM

## 2023-07-13 DIAGNOSIS — Z825 Family history of asthma and other chronic lower respiratory diseases: Secondary | ICD-10-CM | POA: Diagnosis not present

## 2023-07-13 DIAGNOSIS — Z833 Family history of diabetes mellitus: Secondary | ICD-10-CM

## 2023-07-13 DIAGNOSIS — H4710 Unspecified papilledema: Secondary | ICD-10-CM | POA: Diagnosis present

## 2023-07-13 DIAGNOSIS — Z8249 Family history of ischemic heart disease and other diseases of the circulatory system: Secondary | ICD-10-CM | POA: Diagnosis not present

## 2023-07-13 DIAGNOSIS — F909 Attention-deficit hyperactivity disorder, unspecified type: Secondary | ICD-10-CM | POA: Diagnosis present

## 2023-07-13 DIAGNOSIS — F418 Other specified anxiety disorders: Secondary | ICD-10-CM | POA: Diagnosis present

## 2023-07-13 DIAGNOSIS — Z83438 Family history of other disorder of lipoprotein metabolism and other lipidemia: Secondary | ICD-10-CM

## 2023-07-13 DIAGNOSIS — J4520 Mild intermittent asthma, uncomplicated: Secondary | ICD-10-CM | POA: Diagnosis not present

## 2023-07-13 DIAGNOSIS — J45909 Unspecified asthma, uncomplicated: Secondary | ICD-10-CM | POA: Diagnosis present

## 2023-07-13 DIAGNOSIS — Z7984 Long term (current) use of oral hypoglycemic drugs: Secondary | ICD-10-CM | POA: Diagnosis not present

## 2023-07-13 HISTORY — DX: Disorder of trigeminal nerve, unspecified: G50.9

## 2023-07-13 HISTORY — DX: Papilledema associated with increased intracranial pressure: H47.11

## 2023-07-13 HISTORY — DX: Benign intracranial hypertension: G93.2

## 2023-07-13 LAB — CBC WITH DIFFERENTIAL/PLATELET
Abs Immature Granulocytes: 0.04 10*3/uL (ref 0.00–0.07)
Basophils Absolute: 0.1 10*3/uL (ref 0.0–0.1)
Basophils Relative: 1 %
Eosinophils Absolute: 0.2 10*3/uL (ref 0.0–0.5)
Eosinophils Relative: 3 %
HCT: 42.5 % (ref 36.0–46.0)
Hemoglobin: 14 g/dL (ref 12.0–15.0)
Immature Granulocytes: 0 %
Lymphocytes Relative: 34 %
Lymphs Abs: 3.1 10*3/uL (ref 0.7–4.0)
MCH: 26.1 pg (ref 26.0–34.0)
MCHC: 32.9 g/dL (ref 30.0–36.0)
MCV: 79.3 fL — ABNORMAL LOW (ref 80.0–100.0)
Monocytes Absolute: 0.7 10*3/uL (ref 0.1–1.0)
Monocytes Relative: 8 %
Neutro Abs: 4.9 10*3/uL (ref 1.7–7.7)
Neutrophils Relative %: 54 %
Platelets: 290 10*3/uL (ref 150–400)
RBC: 5.36 MIL/uL — ABNORMAL HIGH (ref 3.87–5.11)
RDW: 14.5 % (ref 11.5–15.5)
WBC: 9.1 10*3/uL (ref 4.0–10.5)
nRBC: 0 % (ref 0.0–0.2)

## 2023-07-13 LAB — BASIC METABOLIC PANEL
Anion gap: 9 (ref 5–15)
BUN: 12 mg/dL (ref 6–20)
CO2: 23 mmol/L (ref 22–32)
Calcium: 9.6 mg/dL (ref 8.9–10.3)
Chloride: 106 mmol/L (ref 98–111)
Creatinine, Ser: 0.97 mg/dL (ref 0.44–1.00)
GFR, Estimated: >60 mL/min (ref >60)
Glucose, Bld: 103 mg/dL — ABNORMAL HIGH (ref 70–99)
Potassium: 4.4 mmol/L (ref 3.5–5.1)
Sodium: 138 mmol/L (ref 135–145)

## 2023-07-13 LAB — APTT: aPTT: 28 s (ref 24–36)

## 2023-07-13 LAB — PROTIME-INR
INR: 1.2 (ref 0.8–1.2)
Prothrombin Time: 15.4 s — ABNORMAL HIGH (ref 11.4–15.2)

## 2023-07-13 LAB — PREGNANCY, URINE: Preg Test, Ur: NEGATIVE

## 2023-07-13 MED ORDER — GADOBUTROL 1 MMOL/ML IV SOLN
10.0000 mL | Freq: Once | INTRAVENOUS | Status: AC | PRN
  Administered 2023-07-13: 10 mL via INTRAVENOUS

## 2023-07-13 MED ORDER — DM-GUAIFENESIN ER 30-600 MG PO TB12: 1.0000 | ORAL_TABLET | Freq: Two times a day (BID) | ORAL | Status: DC | PRN

## 2023-07-13 MED ORDER — ONDANSETRON HCL 4 MG/2ML IJ SOLN: 4.0000 mg | Freq: Three times a day (TID) | INTRAMUSCULAR | Status: DC | PRN

## 2023-07-13 MED ORDER — ACETAZOLAMIDE SODIUM 500 MG IJ SOLR
500.0000 mg | Freq: Once | INTRAMUSCULAR | Status: AC
  Administered 2023-07-13: 500 mg via INTRAVENOUS
  Filled 2023-07-13: qty 500

## 2023-07-13 MED ORDER — ACETAZOLAMIDE SODIUM 500 MG IJ SOLR
500.0000 mg | Freq: Two times a day (BID) | INTRAMUSCULAR | Status: DC
  Administered 2023-07-14 – 2023-07-16 (×5): 500 mg via INTRAVENOUS
  Filled 2023-07-13 (×7): qty 500

## 2023-07-13 MED ORDER — ONDANSETRON 4 MG PO TBDP
4.0000 mg | ORAL_TABLET | Freq: Once | ORAL | Status: AC
  Administered 2023-07-13: 4 mg via ORAL
  Filled 2023-07-13: qty 1

## 2023-07-13 MED ORDER — ACETAMINOPHEN 325 MG PO TABS
650.0000 mg | ORAL_TABLET | Freq: Once | ORAL | Status: AC
  Administered 2023-07-13: 650 mg via ORAL
  Filled 2023-07-13: qty 2

## 2023-07-13 MED ORDER — ACETAMINOPHEN 325 MG PO TABS
650.0000 mg | ORAL_TABLET | Freq: Four times a day (QID) | ORAL | Status: DC | PRN
  Administered 2023-07-14 – 2023-07-15 (×4): 650 mg via ORAL
  Filled 2023-07-13 (×5): qty 2

## 2023-07-13 MED ORDER — ALBUTEROL SULFATE (2.5 MG/3ML) 0.083% IN NEBU: 3.0000 mL | INHALATION_SOLUTION | RESPIRATORY_TRACT | Status: DC | PRN

## 2023-07-13 NOTE — ED Notes (Signed)
First nurse note: pt to ED from [facility] for vision change, requesting STAT MRI and LP

## 2023-07-13 NOTE — Telephone Encounter (Signed)
  Name of who is calling: Charlaine Dalton Relationship to Patient:  Best contact number: [phone]  Provider they see: nab  Reason for call: Mom calling to see if they could receive a referral for adult neurology since her symptoms have returned     PRESCRIPTION REFILL ONLY  Name of prescription:  Pharmacy:

## 2023-07-13 NOTE — ED Triage Notes (Signed)
Pt sts that currently she is not able to see in the right eye. Pt sts that her vision in her left eye is blurry but she is still not able to see everything well. 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 Pt sts that she was just seen at the ophthalmologist office and they advised pt to get a MRI and LP.

## 2023-07-13 NOTE — ED Notes (Signed)
Pt aox4 speaking in full clear sentences appears in nad reports visual disturbance in rt eye starting Monday worsening to complete loss in rt eye this date and blurred vision in left eye

## 2023-07-13 NOTE — H&P (Signed)
History and Physical    Anne Ball NWG:[id number] DOB: March 09, 2001 DOA: 07/13/2023  Referring MD/NP/PA:   PCP: Earleen Newport, NP   Patient coming from:  The patient is coming from home.     Chief Complaint: Vision change, headache  HPI: Anne Ball is a 23 y.o. 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  Patient states that she has history of pseudotumor cerebri and increased intracranial pressure.  Patient used to be following up with pediatric neurology. She had routine visits until about 2019.  Currently patient is annually following up with Dr. Druscilla Brownie at [facility].  She states that she started having vision change 4 days ago, described as double vision and blurry vision, right eye is worse than the left.  Associated with headache which is located in the front and occipital area.  She has nausea and vomited few times with nonbilious nonbloody vomiting.  No abdominal pain or diarrhea.  No chest pain, cough, SOB.  No fever or chills.  No symptoms of UTI.  She was evaluated by Dr. Gerome Sam office, who confirmed papilledema on his exam. Pt was sent to ED for MRI of the brain, and therapeutic LP for her ICH.    Data reviewed independently and ED Course: pt was found to have WBC 9.1, GFR> 60, temperature normal, blood pressure 127/93, heart rate 58, RR 18, oxygen saturation 100% on room air.  Patient is admitted to PCU as inpatient.  MRI of the brain: 1. Papilledema, mild optic nerve sheath dilatation and possible narrowing of the midportion of the left transverse sinus. These findings are nonspecific but can be seen in the setting of idiopathic intracranial hypertension. 2. Otherwise normal MRI of the brain.   EKG: I have personally reviewed.  Sinus rhythm, QTc 380, bradycardia, no ischemic change.   Review of Systems:    General: no fevers, chills, no body weight gain, fatigue HEENT: no blurry vision, hearing changes or sore throat Respiratory: no dyspnea, coughing, wheezing CV: no chest pain, no palpitations GI: no nausea, vomiting, abdominal pain, diarrhea, constipation GU: no dysuria, burning on urination, increased urinary frequency, hematuria  Ext: no leg edema Neuro: no unilateral weakness, numbness, or tingling, no hearing loss. Has vision change and headache. Skin: no rash, no skin tear. MSK: No muscle spasm, no deformity, no limitation of range of movement in spin Heme: No easy bruising.  Travel history: No recent long distant travel.   Allergy:  Allergies  Allergen Reactions   Cat Dander    Dog Epithelium (Canis Lupus Familiaris)    Gramineae Pollens    Horse Epithelium     Past Medical History:  Diagnosis Date   Anxiety    Asthma    Depression    Fifth nerve palsy    Papilledema associated with increased intracranial pressure    Pseudotumor cerebri     Past Surgical History:  Procedure Laterality Date   NO PAST SURGERIES      Social History:  reports that she has never smoked. She has never used smokeless tobacco. She reports that she does not drink alcohol and does not use drugs.  Family History:  Family History  Problem Relation Age of Onset   Anxiety disorder Mother    Depression Mother    Diabetes Mother    Hypertension Mother    Hyperlipidemia Mother    Asthma Father  Migraines Neg Hx    Seizures Neg Hx    Autism Neg Hx    ADD / ADHD Neg Hx    Bipolar disorder Neg Hx    Schizophrenia Neg Hx      Prior to Admission medications   Medication Sig Start Date End Date Taking? Authorizing Provider  Albuterol (VENTOLIN IN) Inhale 2 puffs into the lungs every 4 (four) hours as needed (shortness of breath).     [provider]  Cholecalciferol (VITAMIN D3) 50 MCG (2000 UT) capsule Take 2,000 Units by mouth 2 (two) times daily.    [provider]  FLUoxetine (PROZAC) 20 MG tablet Take 3 tablets (60 mg total) by mouth daily. 10/26/22   [name], MD  fluticasone (FLONASE) 50 MCG/ACT nasal spray Place 2 sprays into both nostrils daily as needed for allergies or rhinitis. 07/28/17   [provider]  metFORMIN (GLUCOPHAGE-XR) 500 MG 24 hr tablet Take 1,000 mg by mouth 2 (two) times daily. 10/11/21   [provider]  methylphenidate (CONCERTA) 54 MG PO CR tablet Take 1 tablet (54 mg total) by mouth every morning. 08/26/22   [name], MD  methylphenidate (CONCERTA) 54 MG PO CR tablet Take 1 tablet (54 mg total) by mouth every morning. 10/26/22   [name], MD  montelukast (SINGULAIR) 10 MG tablet Take 10 mg by mouth at bedtime.    [provider]  QVAR REDIHALER 80 MCG/ACT inhaler Inhale 2 puffs into the lungs 2 (two) times daily. 04/09/21   [provider]    Physical Exam: Vitals:   07/13/23 1557 07/13/23 1611 07/13/23 2109 07/13/23 2340  BP:    (!) 127/93  Pulse:    (!) 58  Resp:    15  Temp:   (!) 97.5 F (36.4 C) (!) 97.5 F (36.4 C)  TempSrc:   Oral Oral  SpO2:    100%  Weight: 108.9 kg 108.8 kg    Height:  4\' 11"  (1.499 m)     General: Not in acute distress HEENT:       Eyes: PERRL, EOMI, no jaundice       ENT: No discharge from the ears and nose, no pharynx injection, no tonsillar enlargement.        Neck: No JVD, no bruit, no mass felt. Heme: No neck lymph node enlargement. Cardiac: S1/S2, RRR, No murmurs, No gallops or rubs. Respiratory: No rales, wheezing, rhonchi or rubs. GI: Soft, nondistended, nontender, no rebound pain, no organomegaly, BS present. GU: No hematuria Ext: No pitting leg edema bilaterally. 1+DP/PT pulse bilaterally. Musculoskeletal: No joint deformities, No joint redness or warmth, no limitation of ROM in spin. Skin: No rashes.  Neuro: Alert, oriented X3, cranial nerves II-XII grossly intact except for double vision and blurry vision, moves all  extremities normally. Muscle strength 5/5 in all extremities, sensation to light touch intact.  Psych: Patient is not psychotic, no suicidal or hemocidal ideation.  Labs on Admission: I have personally reviewed following labs and imaging studies  CBC: Recent Labs  Lab 07/13/23 1545  WBC 9.1  NEUTROABS 4.9  HGB 14.0  HCT 42.5  MCV 79.3*  PLT 290   Basic Metabolic Panel: Recent Labs  Lab 07/13/23 1545  NA 138  K 4.4  CL 106  CO2 23  GLUCOSE 103*  BUN 12  CREATININE 0.97  CALCIUM 9.6   GFR: Estimated Creatinine Clearance: 100.5 mL/min (by C-G formula based on SCr of 0.97 mg/dL).  Liver Function Tests: No results for input(s): "AST", "ALT", "ALKPHOS", "BILITOT", "PROT", "ALBUMIN" in the last 168 hours. No results for input(s): "LIPASE", "AMYLASE" in the last 168 hours. No results for input(s): "AMMONIA" in the last 168 hours. Coagulation Profile: Recent Labs  Lab 07/13/23 2055  INR 1.2   Cardiac Enzymes: No results for input(s): "CKTOTAL", "CKMB", "CKMBINDEX", "TROPONINI" in the last 168 hours. BNP (last 3 results) No results for input(s): "PROBNP" in the last 8760 hours. HbA1C: No results for input(s): "HGBA1C" in the last 72 hours. CBG: No results for input(s): "GLUCAP" in the last 168 hours. Lipid Profile: No results for input(s): "CHOL", "HDL", "LDLCALC", "TRIG", "CHOLHDL", "LDLDIRECT" in the last 72 hours. Thyroid Function Tests: No results for input(s): "TSH", "T4TOTAL", "FREET4", "T3FREE", "THYROIDAB" in the last 72 hours. Anemia Panel: No results for input(s): "VITAMINB12", "FOLATE", "FERRITIN", "TIBC", "IRON", "RETICCTPCT" in the last 72 hours. Urine analysis: No results found for: "COLORURINE", "APPEARANCEUR", "LABSPEC", "PHURINE", "GLUCOSEU", "HGBUR", "BILIRUBINUR", "KETONESUR", "PROTEINUR", "UROBILINOGEN", "NITRITE", "LEUKOCYTESUR" Sepsis Labs: @LABRCNTIP (procalcitonin:4,lacticidven:4) )No results found for this or any previous visit (from the past  240 hours).   Radiological Exams on Admission:   Assessment/Plan Principal Problem:   Papilledema Active Problems:   Pseudotumor cerebri   Asthma   Depression with anxiety   ADHD   Obesity, Class III, BMI 40-49.9 (morbid obesity) (HCC)   Assessment and Plan:   Papilledema and pseudotumor cerebri: MRI showed papilledema, mild optic nerve sheath dilatation and possible intracranial hypertension. -will admit to PCU as inpt -start Diamox 500 mg twice daily, may increase dose if patient tolerates well -ordered therapeutic lumbar puncture to be done by IR -Fall precaution -Frequent neurocheck -As needed Tylenol for headache  Asthma: Stable -Bronchodilators and as needed Mucinex  Depression with anxiety and ADHD -Concerta and Prozac  Obesity, Class III, BMI 40-49.9 (morbid obesity) (HCC): Body weight 108.8 kg, BMI 48.45 -Encouraged losing weight -Exercise and healthy diet      DVT ppx: SCD  Code Status: Full code   Family Communication:    Yes, patient's mother   at bed side.     Disposition Plan:  Anticipate discharge back to previous environment  Consults called:  none  Admission status and Level of care: Progressive:  as inpt        Dispo: The patient is from: Home              Anticipated d/c is to: Home              Anticipated d/c date is: 2 days              Patient currently is not medically stable to d/c.    Severity of Illness:  The appropriate patient status for this patient is INPATIENT. Inpatient status is judged to be reasonable and necessary in order to provide the required intensity of service to ensure the patient's safety. The patient's presenting symptoms, physical exam findings, and initial radiographic and laboratory data in the context of their chronic comorbidities is felt to place them at high risk for further clinical deterioration. Furthermore, it is not anticipated that the patient will be medically stable for discharge from the  hospital within 2 midnights of admission.   * I certify that at the point of admission it is my clinical judgment that the patient will require inpatient hospital care spanning beyond 2 midnights from the point of admission due to high intensity of service, high risk for further deterioration and high frequency of  surveillance required.*       Date of Service 07/14/2023    Lorretta Harp [facility]   If 7PM-7AM, please contact night-coverage [email or web address] 07/14/2023, 1:03 AM

## 2023-07-13 NOTE — ED Provider Notes (Signed)
[facility] Emergency Department Provider Note     Event Date/Time   First MD Initiated Contact with Patient 07/13/23 1628     (approximate)   History   Visual Field Change   HPI  Anne Ball is a 23 y.o. 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  She had been previously for by pediatric neurology, for her pseudotumor cerebri and intracranial hypertension.  She had routine visits until about 2019.  She has also been followed annually by Dr. Druscilla Brownie at [facility].  She would endorse some vision change since Monday.  Since that time she has had 3 days of decreased vision in the right eye as well as blurry vision in the left eye.  She would also endorse some intermittent headaches with associated vomiting over the last 2 weeks.  Patient initially had associated ill symptoms with recently being diagnosed with influenza.  She presents to the ED today, after persistent visual disturbance.  She was evaluated by Dr. Gerome Sam office, who confirmed papilledema on his exam.  He suggested patient report to the ED for MRI of the brain, and therapeutic LP for her ICH.     Physical Exam   Triage Vital Signs: ED Triage Vitals  Encounter Vitals Group     BP 07/13/23 1556 (!) 127/93     Systolic BP Percentile --      Diastolic BP Percentile --      Pulse Rate 07/13/23 1556 (!) 58     Resp 07/13/23 1556 18     Temp 07/13/23 1556 98 F (36.7 C)     Temp Source 07/13/23 1556 Oral     SpO2 07/13/23 1556 100 %     Weight 07/13/23 1557 240 lb (108.9 kg)     Height 07/13/23 1611 4\' 11"  (1.499 m)     Head Circumference --      Peak Flow --      Pain Score 07/13/23 1557 7     Pain Loc --      Pain Education --      Exclude from Growth Chart --     Most recent vital signs: Vitals:   07/13/23 1556  BP: (!) 127/93   Pulse: (!) 58  Resp: 18  Temp: 98 F (36.7 C)  SpO2: 100%    General Awake, no distress. NAD HEENT NCAT. PERRL. EOMI. No rhinorrhea. Mucous membranes are moist.  CV:  Good peripheral perfusion. RRR RESP:  Normal effort. CTA ABD:  No distention.  NEURO: CN II-XII grossly intact.   ED Results / Procedures / Treatments   Labs (all labs ordered are listed, but only abnormal results are displayed) Labs Reviewed  BASIC METABOLIC PANEL - Abnormal; Notable for the following components:      Result Value   Glucose, Bld 103 (*)    All other components within normal limits  CBC WITH DIFFERENTIAL/PLATELET - Abnormal; Notable for the following components:   RBC 5.36 (*)    MCV 79.3 (*)    All other components within normal limits     EKG   RADIOLOGY  I personally viewed and evaluated these images as part of my medical decision making, as well as reviewing the written report by the radiologist.  ED Provider Interpretation: Papilledema  MR Brain W and Wo Contrast Result Date: 07/13/2023 CLINICAL DATA:  Central vision  loss.  Diplopia. EXAM: MRI HEAD WITHOUT AND WITH CONTRAST TECHNIQUE: Multiplanar, multiecho pulse sequences of the brain and surrounding structures were obtained without and with intravenous contrast. CONTRAST:  10mL GADAVIST GADOBUTROL 1 MMOL/ML IV SOLN COMPARISON:  12/29/2017 FINDINGS: Brain: No acute infarct, mass effect or extra-axial collection. No acute or chronic hemorrhage. Normal white matter signal, parenchymal volume and CSF spaces. The midline structures are normal. There is no abnormal contrast enhancement. Vascular: Normal flow voids. Possible narrowing of the midportion of the left transverse sinus. Skull and upper cervical spine: Normal calvarium and skull base. Visualized upper cervical spine and soft tissues are normal. Sinuses/Orbits:Papilledema is visible on the left. Mildly increased amount of CSF in the optic nerve sheaths, relatively equivocal.  IMPRESSION: 1. Papilledema, mild optic nerve sheath dilatation and possible narrowing of the midportion of the left transverse sinus. These findings are nonspecific but can be seen in the setting of idiopathic intracranial hypertension. 2. Otherwise normal MRI of the brain. Electronically Signed   By: Deatra Robinson M.D.   On: 07/13/2023 19:28     PROCEDURES:  Critical Care performed: No  Procedures   MEDICATIONS ORDERED IN ED: Medications  acetaminophen (TYLENOL) tablet 650 mg (650 mg Oral Given 07/13/23 1701)  ondansetron (ZOFRAN-ODT) disintegrating tablet 4 mg (4 mg Oral Given 07/13/23 1701)  acetaZOLAMIDE (DIAMOX) injection 500 mg (500 mg Intravenous Given 07/13/23 1845)  gadobutrol (GADAVIST) 1 MMOL/ML injection 10 mL (10 mLs Intravenous Contrast Given 07/13/23 1812)     IMPRESSION / MDM / ASSESSMENT AND PLAN / ED COURSE  I reviewed the triage vital signs and the nursing notes.                              Differential diagnosis includes, but is not limited to, intracranial hemorrhage, meningitis/encephalitis, previous head trauma, cavernous venous thrombosis, tension headache, temporal arteritis, migraine or migraine equivalent, idiopathic intracranial hypertension, and non-specific headache.   Patient's presentation is most consistent with acute complicated illness / injury requiring diagnostic workup.   ----------------------------------------- 7:49 PM on 07/13/2023 ----------------------------------------- Patient and her mother who is at bedside, made aware of the MRI results confirming papilledema with clinical concern for intracranial hypertension.  We discussed current treatment plan which is to admit the patient to the hospital service, and have IR perform her fluoroscopy guided LP tomorrow given her body habitus.  Both ladies are understanding of the plan and agreeable to proceed.  ----------------------------------------- 8:18 PM on  07/13/2023 ----------------------------------------- Spoke with Dr. Clyde Lundborg regarding the case.  He will be in consultation with my attending, Dr. Lenard Lance for ongoing evaluation and management of this patient.  Patient's diagnosis is consistent with papilledema associated with increased intracranial pressure.  Patient presents for visual disturbance over the last 3 to 4 days.  She presents from [facility] ophthalmology Dr. Druscilla Brownie confirm the diagnosis during his dilated visual exam.  MRI confirmed papilledema bilaterally and concern for ICH.  Patient body habitus is not amenable for attempted bedside LP.  As such, Dr. Lenard Lance is recommending admission to the hospital service, for IR LP in the morning.  Patient given initial dose of Diamox in the ED for management of her symptoms.  She is not endorsing any acute pain or discomfort at this time.    FINAL CLINICAL IMPRESSION(S) / ED DIAGNOSES   Final diagnoses:  Intracranial hypertension  Papilledema associated with increased intracranial pressure     Rx / DC Orders   ED  Discharge Orders     None        Note:  This document was prepared using Dragon voice recognition software and may include unintentional dictation errors.    Lissa Hoard, PA-C 07/14/23 1625    Minna Antis, MD 07/14/23 2248

## 2023-07-13 NOTE — ED Notes (Signed)
Pt remains aox4 pt family @ bs provided blanket and pillow for comfort pt denies further needs @ this time

## 2023-07-13 NOTE — ED Provider Notes (Signed)
-----------------------------------------   8:23 PM on 07/13/2023 ----------------------------------------- I have seen and evaluated the patient in conjunction with the physician assistant.  Patient was sent by ophthalmology for signs of papilledema and sent to the emergency department for further workup.  Patient has MRI showing signs of likely intracranial hypertension as well.  Patient states her symptoms have been ongoing since the weekend.  Given the patient's visual complaint with history of ICP and MRI concerning for possible ICP and papilledema visible on ophthalmology exam we will start the patient on IV acetazolamide.  I have personally seen and evaluated the patient she has a BMI of 48.4.  Body habitus would prevent a bedside LP and the patient would prefer to have the LP under fluoroscopy which I believe would be a much safer method.  Given nearly a week of symptoms I believe it would be safe to admit the patient to the hospital continue acetazolamide overnight and allow IR to more safely perform a therapeutic lumbar puncture in the morning if necessary.  Patient and mother are agreeable to this plan as well.   Minna Antis, MD 07/13/23 2025

## 2023-07-13 NOTE — Telephone Encounter (Signed)
Called mom to inform her that referral has been place to [facility] on [address].   Mom understood message

## 2023-07-13 NOTE — Telephone Encounter (Signed)
Called mom to let her know that referral has to be sent in by pcp per Dr. Burley Saver request. Mom stated she doesn't have pcp. I let her know that I will pass the message along.  Mom understood

## 2023-07-13 NOTE — Telephone Encounter (Signed)
Called mom to let her know that I received message about referral, told her that I will be sending this message over to Dr. Merri Brunette and once I receive a response I will call her back and inform her.  Mom understood message

## 2023-07-13 NOTE — ED Notes (Signed)
Pt provided food per request diet order ok for food per admitting physician

## 2023-07-13 NOTE — Telephone Encounter (Signed)
 I placed the referral.

## 2023-07-13 NOTE — ED Provider Triage Note (Signed)
Emergency Medicine Provider Triage Evaluation Note  Anne Ball , a 23 y.o. female  was evaluated in triage.  Pt complains of vision loss. Patient reports double vision on Monday and it has gotten worse. Patient sent over by Dr. Druscilla Brownie for emergent MRI and lumbar puncture.  Review of Systems  Positive: Vision loss Negative:   Physical Exam  There were no vitals taken for this visit. Gen:   Awake, no distress   Resp:  Normal effort  MSK:   Moves extremities without difficulty  Other:    Medical Decision Making  Medically screening exam initiated at 3:53 PM.  Appropriate orders placed.  Christabell Loseke was informed that the remainder of the evaluation will be completed by another provider, this initial triage assessment does not replace that evaluation, and the importance of remaining in the ED until their evaluation is complete.     Cameron Ali, PA-C 07/13/23 1601

## 2023-07-13 NOTE — ED Notes (Signed)
See triage note  Presents with headache which she has had for several weeks  but states she developed blurred vision for the past few days  Hx of ICP

## 2023-07-14 ENCOUNTER — Encounter: Payer: Self-pay | Admitting: Internal Medicine

## 2023-07-14 ENCOUNTER — Inpatient Hospital Stay: Payer: BC Managed Care – PPO

## 2023-07-14 DIAGNOSIS — H4710 Unspecified papilledema: Secondary | ICD-10-CM | POA: Diagnosis not present

## 2023-07-14 DIAGNOSIS — G932 Benign intracranial hypertension: Secondary | ICD-10-CM | POA: Diagnosis not present

## 2023-07-14 DIAGNOSIS — F418 Other specified anxiety disorders: Secondary | ICD-10-CM | POA: Diagnosis not present

## 2023-07-14 LAB — CBC
HCT: 39 % (ref 36.0–46.0)
Hemoglobin: 13.2 g/dL (ref 12.0–15.0)
MCH: 26.7 pg (ref 26.0–34.0)
MCHC: 33.8 g/dL (ref 30.0–36.0)
MCV: 78.8 fL — ABNORMAL LOW (ref 80.0–100.0)
Platelets: 249 10*3/uL (ref 150–400)
RBC: 4.95 MIL/uL (ref 3.87–5.11)
RDW: 14.6 % (ref 11.5–15.5)
WBC: 9 10*3/uL (ref 4.0–10.5)
nRBC: 0 % (ref 0.0–0.2)

## 2023-07-14 LAB — BASIC METABOLIC PANEL
Anion gap: 11 (ref 5–15)
BUN: 12 mg/dL (ref 6–20)
CO2: 21 mmol/L — ABNORMAL LOW (ref 22–32)
Calcium: 9.4 mg/dL (ref 8.9–10.3)
Chloride: 104 mmol/L (ref 98–111)
Creatinine, Ser: 0.99 mg/dL (ref 0.44–1.00)
GFR, Estimated: >60 mL/min (ref >60)
Glucose, Bld: 101 mg/dL — ABNORMAL HIGH (ref 70–99)
Potassium: 3.7 mmol/L (ref 3.5–5.1)
Sodium: 136 mmol/L (ref 135–145)

## 2023-07-14 LAB — HIV ANTIBODY (ROUTINE TESTING W REFLEX): HIV Screen 4th Generation wRfx: NONREACTIVE

## 2023-07-14 LAB — MAGNESIUM: Magnesium: 2.4 mg/dL (ref 1.7–2.4)

## 2023-07-14 MED ORDER — BECLOMETHASONE DIPROP HFA 80 MCG/ACT IN AERB: 2.0000 | INHALATION_SPRAY | Freq: Two times a day (BID) | RESPIRATORY_TRACT | Status: DC

## 2023-07-14 MED ORDER — LIDOCAINE HCL (PF) 1 % IJ SOLN: 8.0000 mL | Freq: Once | INTRAMUSCULAR | Status: DC

## 2023-07-14 MED ORDER — MORPHINE SULFATE (PF) 2 MG/ML IV SOLN
2.0000 mg | INTRAVENOUS | Status: DC | PRN
  Administered 2023-07-14: 2 mg via INTRAVENOUS
  Filled 2023-07-14: qty 1

## 2023-07-14 MED ORDER — ALBUTEROL SULFATE HFA 108 (90 BASE) MCG/ACT IN AERS: 1.0000 | INHALATION_SPRAY | Freq: Four times a day (QID) | RESPIRATORY_TRACT | Status: DC | PRN

## 2023-07-14 MED ORDER — FLUOXETINE HCL 20 MG PO CAPS
60.0000 mg | ORAL_CAPSULE | Freq: Every day | ORAL | Status: DC
  Administered 2023-07-14: 20 mg via ORAL
  Administered 2023-07-15 – 2023-07-16 (×2): 60 mg via ORAL
  Filled 2023-07-14 (×3): qty 3

## 2023-07-14 MED ORDER — METHYLPHENIDATE HCL ER (OSM) 27 MG PO TBCR
54.0000 mg | EXTENDED_RELEASE_TABLET | Freq: Every day | ORAL | Status: DC
  Administered 2023-07-14 – 2023-07-16 (×3): 54 mg via ORAL
  Filled 2023-07-14 (×3): qty 2

## 2023-07-14 MED ORDER — STERILE WATER FOR INJECTION IJ SOLN
INTRAMUSCULAR | Status: AC
  Filled 2023-07-14: qty 10

## 2023-07-14 MED ORDER — ALBUTEROL SULFATE (2.5 MG/3ML) 0.083% IN NEBU: 3.0000 mL | INHALATION_SOLUTION | Freq: Four times a day (QID) | RESPIRATORY_TRACT | Status: DC | PRN

## 2023-07-14 MED ORDER — BUDESONIDE 0.25 MG/2ML IN SUSP
0.2500 mg | Freq: Two times a day (BID) | RESPIRATORY_TRACT | Status: DC
  Administered 2023-07-14 – 2023-07-16 (×4): 0.25 mg via RESPIRATORY_TRACT
  Filled 2023-07-14 (×5): qty 2

## 2023-07-14 MED ORDER — FLUOXETINE HCL 20 MG PO CAPS
60.0000 mg | ORAL_CAPSULE | Freq: Every day | ORAL | Status: DC
  Filled 2023-07-14: qty 3

## 2023-07-14 NOTE — ED Notes (Signed)
Pt has medication with possible side effects of increased pressure secure chat sent to NP Jon Billings to clarify continuing medications possibly causing increased pt for pt receiving Diamox IVP for possible increased eye and ICP

## 2023-07-14 NOTE — ED Notes (Signed)
NP Jon Billings response to concerta medication, Pulmicort medication order to continue Pulmicort and defer to day shift team for Concerta medication order

## 2023-07-14 NOTE — Progress Notes (Addendum)
 Progress Note   Patient: Anne Ball YTK:[id number] DOB: 03/28/2001 DOA: 07/13/2023     1 DOS: the patient was seen and examined on 07/14/2023   Brief hospital course: Anne Ball is a 23 y.o. 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   MRI of the brain showed papilledema, idiopathic intracranial hypertension.  Patient is admitted to [facility] service, started on Diamox therapy, had lumbar puncture done by radiology team.  Assessment and Plan: Papilledema and pseudotumor cerebri:  MRI showed papilledema, mild optic nerve sheath dilatation and possible intracranial hypertension. Patient will be closely monitored. Status post lumbar puncture with opening pressure greater than 65 cm, 18 mL CSF removed.  Closing pressure 15 cm Continue Diamox 500 mg twice daily.  Neurology evaluation called. Continue neurochecks as per protocol. As needed Tylenol for headache.   Asthma: Stable Ventolin as needed ordered.   Depression with anxiety and ADHD Continue home dose Concerta and Prozac   Obesity, Class III, BMI 40-49.9 (morbid obesity) (HCC):  Body weight 108.8 kg, BMI 48.45 Diet, exercise and weight reduction advised.      Out of bed to chair. Incentive spirometry. Nursing supportive care. Fall, aspiration precautions. DVT prophylaxis   Code Status: Full Code  Subjective: Patient is seen and examined today morning in ED. She complains of no headache. Feels better today. She got LP and resting. Family at bedside.  Physical Exam: Vitals:   07/14/23 0153 07/14/23 0540 07/14/23 0547 07/14/23 1024  BP: 122/82  116/71 109/67  Pulse: 82  (!) 54 62  Resp: 15  19 18   Temp: 97.8 F (36.6 C) 97.8 F (36.6 C) 97.7 F (36.5 C) (!) 97.5 F (36.4 C)  TempSrc: Oral Oral Oral Oral  SpO2: 99%  100% 100%  Weight:      Height:        General -  Young  morbidly obese African American female, no apparent distress HEENT - PERRLA, EOMI, atraumatic head, non tender sinuses. Lung - distant, no rales, rhonchi, wheezes. Heart - S1, S2 heard, no murmurs, rubs, no pedal edema. Abdomen - Soft, non tender, bowel sounds good Neuro - Alert, awake and oriented x 3, non focal exam. Skin - Warm and dry.  Data Reviewed:      Latest Ref Rng & Units 07/14/2023    4:50 AM 07/13/2023    3:45 PM 07/18/2018   11:38 AM  CBC  WBC 4.0 - 10.5 K/uL 9.0  9.1  7.2   Hemoglobin 12.0 - 15.0 g/dL 55.7  32.2  02.5   Hematocrit 36.0 - 46.0 % 39.0  42.5  38.7   Platelets 150 - 400 K/uL 249  290  244       Latest Ref Rng & Units 07/14/2023    4:50 AM 07/13/2023    3:45 PM 07/18/2018   11:38 AM  BMP  Glucose 70 - 99 mg/dL 427  062  94   BUN 6 - 20 mg/dL 12  12  8    Creatinine 0.44 - 1.00 mg/dL 3.76  2.83  1.51   Sodium 135 - 145 mmol/L 136  138  137   Potassium 3.5 - 5.1 mmol/L 3.7  4.4  4.0   Chloride 98 - 111 mmol/L 104  106  110   CO2 22 - 32 mmol/L 21  23  21    Calcium 8.9 - 10.3 mg/dL 9.4  9.6  8.9    DG FL GUIDED THERAPEUTIC LUMBAR PUNCTURE Result Date: 07/14/2023 CLINICAL DATA:  Provided history: Papilledema. Request received for therapeutic lumbar puncture. EXAM: LUMBAR PUNCTURE UNDER FLUOROSCOPY PROCEDURE: Alwyn Ren, NP obtained informed consent from the patient prior to the procedure. This process included a discussion of procedure risks. The patient was positioned prone on the fluoroscopy table. An appropriate skin entry site was determined under fluoroscopy and marked. The operator donned sterile gloves and a mask. The lower back was prepped and draped in the usual sterile fashion. Local anesthesia was provided with 1% lidocaine. Under intermittent fluoroscopy, lumbar puncture was performed at the L4-L5 level using a 20 gauge spinal needle with return of clear/colorless CSF. There was an opening pressure of greater than 65 cm water (exceeding  the height of the spinal manometer). 18 mL of CSF were removed. There was a closing pressure of 15 cm water. The inner stylet was replaced within the needle and the needle was removed in its entirety. A dressing was applied at the skin entry site. The patient tolerated the procedure well and no immediate post-procedure complication was apparent. The procedure was performed by Alwyn Ren, NP, supervised by Dr. Jackey Loge. FLUOROSCOPY: Radiation Exposure Index (as provided by the fluoroscopic device): 43.20 mGy Kerma IMPRESSION: 1. Technically successful fluoroscopically-guided L4-L5 lumbar puncture. 2. Opening pressure: > 65 cm water. 3. 18 mL of CSF removed. 4. Closing pressure: 15 cm water. 5. No immediate post-procedure complication. Electronically Signed   By: Jackey Loge D.O.   On: 07/14/2023 10:03   MR Brain W and Wo Contrast Result Date: 07/13/2023 CLINICAL DATA:  Central vision loss.  Diplopia. EXAM: MRI HEAD WITHOUT AND WITH CONTRAST TECHNIQUE: Multiplanar, multiecho pulse sequences of the brain and surrounding structures were obtained without and with intravenous contrast. CONTRAST:  10mL GADAVIST GADOBUTROL 1 MMOL/ML IV SOLN COMPARISON:  12/29/2017 FINDINGS: Brain: No acute infarct, mass effect or extra-axial collection. No acute or chronic hemorrhage. Normal white matter signal, parenchymal volume and CSF spaces. The midline structures are normal. There is no abnormal contrast enhancement. Vascular: Normal flow voids. Possible narrowing of the midportion of the left transverse sinus. Skull and upper cervical spine: Normal calvarium and skull base. Visualized upper cervical spine and soft tissues are normal. Sinuses/Orbits:Papilledema is visible on the left. Mildly increased amount of CSF in the optic nerve sheaths, relatively equivocal. IMPRESSION: 1. Papilledema, mild optic nerve sheath dilatation and possible narrowing of the midportion of the left transverse sinus. These findings are  nonspecific but can be seen in the setting of idiopathic intracranial hypertension. 2. Otherwise normal MRI of the brain. Electronically Signed   By: Deatra Robinson M.D.   On: 07/13/2023 19:28   Family Communication: Discussed with family at bedside, they understand and agree. All questions answereed.  Disposition: Status is: Inpatient Remains inpatient appropriate because: s/p LP, once symptoms better.  Planned Discharge Destination: Home     Time spent: 38 minutes  Author: Marcelino Duster, MD 07/14/2023 1:05 PM Secure chat 7am to 7pm For on call review [email or web address].

## 2023-07-14 NOTE — ED Notes (Signed)
Informed RN bed assigned 

## 2023-07-14 NOTE — Procedures (Signed)
Therapeutic lumbar puncture performed. Please see full dictation under the imaging tab in Epic.  Alwyn Ren, AGACNP-BC 07/14/2023, 9:30 AM

## 2023-07-15 DIAGNOSIS — F418 Other specified anxiety disorders: Secondary | ICD-10-CM | POA: Diagnosis not present

## 2023-07-15 DIAGNOSIS — G932 Benign intracranial hypertension: Secondary | ICD-10-CM | POA: Diagnosis not present

## 2023-07-15 DIAGNOSIS — H4710 Unspecified papilledema: Secondary | ICD-10-CM | POA: Diagnosis not present

## 2023-07-15 NOTE — Plan of Care (Signed)

## 2023-07-15 NOTE — Consult Note (Signed)
 NEUROLOGY CONSULT NOTE   Date of service: July 15, 2023 Patient Name: Anne Ball MRN:  [id number] DOB:  08-21-2000 Chief Complaint: Bilateral visual blurring and headache Requesting Provider: Marcelino Duster, MD  History of Present Illness  Anne Ball is a 23 y.o. 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 She was sent here from her Ophthalmologist's office with recommendations for LP and MRI brain after he noted bilateral papilledema on retinal exam. She states that she stopped Diamox about 2 years ago. Diamox has been restarted this admission at 500 mg BID.   She underwent LP here with an opening pressure of > 65 cm water; 18 mL of CSF removed with closing pressure of 15 cm water.  MRI brain with MRV reveals bilateral transverse sinus stenosis and mild optic nerve sheath enlargement. No acute intracranial process or abnormal enhancement of the brain. No dural venous sinus thrombosis.  Her headache is now decreased to 4/10 and now only involves the bifrontal region with some retroorbital pain, worse on the right.   Additional HPI obtained from Hospitalist H+P has been reviewed: "Patient states that she has history of pseudotumor cerebri and increased intracranial pressure.  Patient used to be following up with pediatric neurology. She had routine visits until about 2019.  Currently patient is annually following up with Dr. Druscilla Brownie at [facility]. She states that she started having vision change 4 days ago, described as double vision and blurry vision, right eye is worse than the left.  Associated with headache which is located in the front and occipital area.  She has nausea and vomited few times with nonbilious nonbloody vomiting.  No abdominal pain or  diarrhea.  No chest pain, cough, SOB.  No fever or chills.  No symptoms of UTI. She was evaluated by Dr. Gerome Sam office, who confirmed papilledema on his exam."  ROS  Comprehensive ROS performed and pertinent positives documented in HPI    Past History   Past Medical History:  Diagnosis Date   Anxiety    Asthma    Depression    Fifth nerve palsy    Papilledema associated with increased intracranial pressure    Pseudotumor cerebri     Past Surgical History:  Procedure Laterality Date   NO PAST SURGERIES      Family History: Family History  Problem Relation Age of Onset   Anxiety disorder Mother    Depression Mother    Diabetes Mother    Hypertension Mother    Hyperlipidemia Mother    Asthma Father    Migraines Neg Hx    Seizures Neg Hx    Autism Neg Hx    ADD / ADHD Neg Hx    Bipolar disorder Neg Hx    Schizophrenia Neg Hx     Social History  reports that she has never smoked. She has never used smokeless tobacco. She reports that she does not drink alcohol and does not use drugs.  Allergies  Allergen Reactions   Cat Dander    Dog Epithelium (Canis Lupus Familiaris)    Gramineae Pollens    Horse Epithelium     Medications   Current Facility-Administered Medications:    acetaminophen (TYLENOL) tablet 650 mg, 650 mg, Oral, Q6H PRN, Lorretta Harp, MD, 650 mg at 07/14/23 1724   acetaZOLAMIDE (DIAMOX) injection 500  mg, 500 mg, Intravenous, Q12H, Lorretta Harp, MD, 500 mg at 07/15/23 0537   albuterol (VENTOLIN HFA) 108 (90 Base) MCG/ACT inhaler 1-2 puff, 1-2 puff, Inhalation, Q6H PRN, Clide Dales, Narendranath, MD   budesonide (PULMICORT) nebulizer solution 0.25 mg, 0.25 mg, Nebulization, BID, Belue, Lendon Collar, RPH, 0.25 mg at 07/15/23 0750   dextromethorphan-guaiFENesin (MUCINEX DM) 30-600 MG per 12 hr tablet 1 tablet, 1 tablet, Oral, BID PRN, Lorretta Harp, MD   FLUoxetine (PROZAC) capsule 60 mg, 60 mg, Oral, Daily, Sreeram, Narendranath, MD, 20 mg at 07/14/23 1142    lidocaine (PF) (XYLOCAINE) 1 % injection 8 mL, 8 mL, Other, Once, Lorretta Harp, MD   methylphenidate (CONCERTA) CR tablet 54 mg, 54 mg, Oral, Daily, Lorretta Harp, MD, 54 mg at 07/14/23 1118   morphine (PF) 2 MG/ML injection 2 mg, 2 mg, Intravenous, Q4H PRN, Anne Duster, MD, 2 mg at 07/14/23 1750   ondansetron (ZOFRAN) injection 4 mg, 4 mg, Intravenous, Q8H PRN, Lorretta Harp, MD  Vitals   Vitals:   07/14/23 2143 07/15/23 0521 07/15/23 0753 07/15/23 0759  BP:  102/72  109/86  Pulse:  76 97 93  Resp:  16 16 17   Temp:  97.8 F (36.6 C)  (!) 97.5 F (36.4 C)  TempSrc:  Oral  Oral  SpO2: 100% 100% 98% 100%  Weight:      Height:        Body mass index is 48.45 kg/m.  Physical Exam   Physical Exam General: Morbid obesity HEENT- [place]/AT   Lungs- Respirations unlabored Extremities- Warm and well-perfused Skin: No rash to face, neck, arms or distal lower extremities.   Neurological Examination Mental Status: Awake and alert. Fully oriented x 5. Thought content appropriate. Speech fluent without evidence of aphasia.  Able to follow all commands without difficulty. Cranial Nerves: II: PERRL Visual fields OS: Intact in all 4 quadrants Visual fields OD: constricted in all 4 quadrants, worse to right upper quadrant Visual acuity is decreased bilaterally, worse on the right No extinction to DSS.  III,IV, VI: No ptosis. EOMI. No nystagmus. No double vision currently.  V: Temp sensation equal bilaterally VII: Smile symmetric VIII: Hearing intact to voice IX,X: No hypophonia or hoarseness XI: Symmetric XII: Midline tongue extension Motor: RUE: 5/5 LUE: 5/5 RLE: 5/5 LLE: 5/5 Sensory: Temp and FT intact x 4. No extinction to DSS. Deep Tendon Reflexes: 2+ and symmetric bilateral biceps, brachioradialis and patellae Cerebellar: No ataxia with FNF bilaterally Gait: Deferred   Labs/Imaging/Neurodiagnostic studies   CBC:  Recent Labs  Lab 08-08-23 1545 07/14/23 0450  WBC 9.1  9.0  NEUTROABS 4.9  --   HGB 14.0 13.2  HCT 42.5 39.0  MCV 79.3* 78.8*  PLT 290 249   Basic Metabolic Panel:  Lab Results  Component Value Date   NA 136 07/14/2023   K 3.7 07/14/2023   CO2 21 (L) 07/14/2023   GLUCOSE 101 (H) 07/14/2023   BUN 12 07/14/2023   CREATININE 0.99 07/14/2023   CALCIUM 9.4 07/14/2023   GFRNONAA >60 07/14/2023   GFRAA NOT CALCULATED 07/18/2018   Lipid Panel:  Lab Results  Component Value Date   LDLCALC 64 08/19/2011   HgbA1c:  Lab Results  Component Value Date   HGBA1C 5.7 08/19/2011   Urine Drug Screen:     Component Value Date/Time   LABOPIA NONE DETECTED 07/18/2018 1138   COCAINSCRNUR NONE DETECTED 07/18/2018 1138   LABBENZ NONE DETECTED 07/18/2018 1138   AMPHETMU NONE DETECTED 07/18/2018 1138  THCU NONE DETECTED 07/18/2018 1138   LABBARB NONE DETECTED 07/18/2018 1138    Alcohol Level     Component Value Date/Time   ETH <10 07/18/2018 1138   INR  Lab Results  Component Value Date   INR 1.2 07/13/2023   APTT  Lab Results  Component Value Date   APTT 28 07/13/2023    Fluoro-guided LP: 1. Technically successful fluoroscopically-guided L4-L5 lumbar puncture. 2. Opening pressure: > 65 cm water. 3. 18 mL of CSF removed. 4. Closing pressure: 15 cm water. 5. No immediate post-procedure complication.  CSF:  - Clear and colorless - Glucose 54 (serum glucose 4.5 hours prior to LP was 101) - RBC 4 - WBC 1 - Protein 19   ASSESSMENT  23 y.o. 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 She was sent here from her Ophthalmologist's office with recommendations for LP and MRI brain after he noted bilateral papilledema on retinal exam. She states that she stopped Diamox about 2 years ago. Diamox has been restarted this  admission at 500 mg BID. Her headache is now decreased to 4/10 and now only involves the bifrontal region with some retroorbital pain, worse on the right.  - Neurological exam reveals visual field constriction OD and decreased visual acuity bilaterally that is worse on the right. No focal weakness or sensory loss.  - She underwent LP here with an opening pressure of > 65 cm water; 18 mL of CSF removed with closing pressure of 15 cm water. CSF labs are normal.  - MRI brain with MRV reveals bilateral transverse sinus stenosis and mild optic nerve sheath enlargement. No acute intracranial process or abnormal enhancement of the brain. No dural venous sinus thrombosis.   RECOMMENDATIONS  - Continue Diamox at 500 mg po BID - Can be discharged tomorrow if headache is resolved and vision continues to improve - Outpatient Ophthalmology follow up for repeat retinal exam in 4 weeks - Outpatient Neurology follow up.  ______________________________________________________________________    Dessa Phi, Mckenna Boruff, MD [facility]

## 2023-07-15 NOTE — Progress Notes (Signed)
 Progress Note   Patient: Anne Ball:[id number] DOB: 2001/03/04 DOA: 07/13/2023     2 DOS: the patient was seen and examined on 07/15/2023   Brief hospital course: Anne Ball is a 23 y.o. 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   MRI of the brain showed papilledema, idiopathic intracranial hypertension.  Patient is admitted to [facility] service, started on Diamox therapy, had lumbar puncture done by radiology team.  Assessment and Plan: Papilledema and pseudotumor cerebri:  MRI showed papilledema, mild optic nerve sheath dilatation and possible intracranial hypertension. Status post lumbar puncture with opening pressure greater than 65 cm, 18 mL CSF removed.  Closing pressure 15 cm Neurology evaluation called. Advised to increase Diamox. Continue neurochecks as per protocol. As needed Tylenol for headache.   Asthma: Stable Ventolin as needed ordered.   Depression with anxiety and ADHD Continue home dose Concerta and Prozac   Obesity, Class III, BMI 40-49.9 (morbid obesity) (HCC):  Body weight 108.8 kg, BMI 48.45 Diet, exercise and weight reduction advised.      Out of bed to chair. Incentive spirometry. Nursing supportive care. Fall, aspiration precautions. DVT prophylaxis   Code Status: Full Code  Subjective: Patient is seen and examined today morning in ED. She complains of on and off headache. Back pain better.  Physical Exam: Vitals:   07/14/23 2143 07/15/23 0521 07/15/23 0753 07/15/23 0759  BP:  102/72  109/86  Pulse:  76 97 93  Resp:  16 16 17   Temp:  97.8 F (36.6 C)  (!) 97.5 F (36.4 C)  TempSrc:  Oral  Oral  SpO2: 100% 100% 98% 100%  Weight:      Height:        General - Young  morbidly obese African American female, no apparent distress HEENT - PERRLA, EOMI, atraumatic head, non tender sinuses. Lung  - distant, no rales, rhonchi, wheezes. Heart - S1, S2 heard, no murmurs, rubs, no pedal edema. Abdomen - Soft, non tender, bowel sounds good Neuro - Alert, awake and oriented x 3, non focal exam. Skin - Warm and dry.  Data Reviewed:      Latest Ref Rng & Units 07/14/2023    4:50 AM 07/13/2023    3:45 PM 07/18/2018   11:38 AM  CBC  WBC 4.0 - 10.5 K/uL 9.0  9.1  7.2   Hemoglobin 12.0 - 15.0 g/dL 81.1  91.4  78.2   Hematocrit 36.0 - 46.0 % 39.0  42.5  38.7   Platelets 150 - 400 K/uL 249  290  244       Latest Ref Rng & Units 07/14/2023    4:50 AM 07/13/2023    3:45 PM 07/18/2018   11:38 AM  BMP  Glucose 70 - 99 mg/dL 956  213  94   BUN 6 - 20 mg/dL 12  12  8    Creatinine 0.44 - 1.00 mg/dL 0.86  5.78  4.69   Sodium 135 - 145 mmol/L 136  138  137   Potassium 3.5 - 5.1 mmol/L 3.7  4.4  4.0   Chloride 98 - 111 mmol/L 104  106  110   CO2 22 - 32 mmol/L 21  23  21    Calcium 8.9 - 10.3 mg/dL 9.4  9.6  8.9    DG FL GUIDED THERAPEUTIC LUMBAR PUNCTURE Result Date: 07/14/2023 CLINICAL DATA:  Provided history: Papilledema. Request received  for therapeutic lumbar puncture. EXAM: LUMBAR PUNCTURE UNDER FLUOROSCOPY PROCEDURE: Anne Ren, NP obtained informed consent from the patient prior to the procedure. This process included a discussion of procedure risks. The patient was positioned prone on the fluoroscopy table. An appropriate skin entry site was determined under fluoroscopy and marked. The operator donned sterile gloves and a mask. The lower back was prepped and draped in the usual sterile fashion. Local anesthesia was provided with 1% lidocaine. Under intermittent fluoroscopy, lumbar puncture was performed at the L4-L5 level using a 20 gauge spinal needle with return of clear/colorless CSF. There was an opening pressure of greater than 65 cm water (exceeding the height of the spinal manometer). 18 mL of CSF were removed. There was a closing pressure of 15 cm water. The inner stylet was  replaced within the needle and the needle was removed in its entirety. A dressing was applied at the skin entry site. The patient tolerated the procedure well and no immediate post-procedure complication was apparent. The procedure was performed by Anne Ren, NP, supervised by Dr. Jackey Ball. FLUOROSCOPY: Radiation Exposure Index (as provided by the fluoroscopic device): 43.20 mGy Kerma IMPRESSION: 1. Technically successful fluoroscopically-guided L4-L5 lumbar puncture. 2. Opening pressure: > 65 cm water. 3. 18 mL of CSF removed. 4. Closing pressure: 15 cm water. 5. No immediate post-procedure complication. Electronically Signed   By: Anne Ball D.O.   On: 07/14/2023 10:03   MR Brain W and Wo Contrast Result Date: 07/13/2023 CLINICAL DATA:  Central vision loss.  Diplopia. EXAM: MRI HEAD WITHOUT AND WITH CONTRAST TECHNIQUE: Multiplanar, multiecho pulse sequences of the brain and surrounding structures were obtained without and with intravenous contrast. CONTRAST:  10mL GADAVIST GADOBUTROL 1 MMOL/ML IV SOLN COMPARISON:  12/29/2017 FINDINGS: Brain: No acute infarct, mass effect or extra-axial collection. No acute or chronic hemorrhage. Normal white matter signal, parenchymal volume and CSF spaces. The midline structures are normal. There is no abnormal contrast enhancement. Vascular: Normal flow voids. Possible narrowing of the midportion of the left transverse sinus. Skull and upper cervical spine: Normal calvarium and skull base. Visualized upper cervical spine and soft tissues are normal. Sinuses/Orbits:Papilledema is visible on the left. Mildly increased amount of CSF in the optic nerve sheaths, relatively equivocal. IMPRESSION: 1. Papilledema, mild optic nerve sheath dilatation and possible narrowing of the midportion of the left transverse sinus. These findings are nonspecific but can be seen in the setting of idiopathic intracranial hypertension. 2. Otherwise normal MRI of the brain. Electronically  Signed   By: Anne Ball M.D.   On: 07/13/2023 19:28   Family Communication: Discussed with family at bedside, they understand and agree. All questions answereed.  Disposition: Status is: Inpatient Remains inpatient appropriate because: s/p LP, neurology eval.  Planned Discharge Destination: Home     Time spent: 37 minutes  Author: Marcelino Duster, MD 07/15/2023 12:53 PM Secure chat 7am to 7pm For on call review [email or web address].

## 2023-07-16 DIAGNOSIS — H4710 Unspecified papilledema: Secondary | ICD-10-CM | POA: Diagnosis not present

## 2023-07-16 DIAGNOSIS — J4520 Mild intermittent asthma, uncomplicated: Secondary | ICD-10-CM | POA: Diagnosis not present

## 2023-07-16 DIAGNOSIS — G932 Benign intracranial hypertension: Secondary | ICD-10-CM | POA: Diagnosis not present

## 2023-07-16 MED ORDER — ACETAZOLAMIDE 250 MG PO TABS: 500.0000 mg | ORAL_TABLET | Freq: Two times a day (BID) | ORAL | 1 refills | Status: AC

## 2023-07-16 MED ORDER — STERILE WATER FOR INJECTION IJ SOLN
INTRAMUSCULAR | Status: AC
  Filled 2023-07-16: qty 10

## 2023-07-16 NOTE — Discharge Summary (Signed)
 Physician Discharge Summary   Patient: Anne Ball MRN: [id number] DOB: November 02, 2000  Admit date:     07/13/2023  Discharge date: 07/16/23  Discharge Physician: Marcelino Duster   PCP: Earleen Newport, NP   Recommendations at discharge:    PCP follow up in 1 week Neurology follow up as scheduled. Eye doctor follow up as prior schedule.  Discharge Diagnoses: Principal Problem:   Papilledema Active Problems:   Pseudotumor cerebri   Asthma   Depression with anxiety   ADHD   Obesity, Class III, BMI 40-49.9 (morbid obesity) (HCC)  Resolved Problems:   * No resolved hospital problems. *  Hospital Course: Anne Ball is a 23 y.o. 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    MRI of the brain showed papilledema, idiopathic intracranial hypertension.  Patient is admitted to [facility] service, started on Diamox therapy, had lumbar puncture done by radiology team. Neurology evaluated her advised to discharge her on Diamox if headaches improve with outpatient follow up.  Assessment and Plan: Papilledema and pseudotumor cerebri:  MRI showed papilledema, mild optic nerve sheath dilatation and possible intracranial hypertension. Status post lumbar puncture with opening pressure greater than 65 cm, 18 mL CSF removed.  Closing pressure 15 cm Neurology evaluated her, advised Diamox 500mg  bid, outpatient neurology and ophthalmology as scheduled. As needed Tylenol for headache.   Asthma: Stable Ventolin as needed ordered.   Depression with anxiety and ADHD Continue home dose Concerta and Prozac   Obesity, Class III, BMI 40-49.9 (morbid obesity) (HCC):  Body weight 108.8 kg, BMI 48.45. Outpatient obesity clinic follow up. Diet, exercise and weight reduction advised.      Consultants: Neurology, IR Procedures performed: Lumbar puncture.   Disposition: Home Diet recommendation:  Discharge Diet Orders (From admission, onward)     Start     Ordered   07/16/23 0000  Diet - low sodium heart healthy        07/16/23 1016           Cardiac diet DISCHARGE MEDICATION: Allergies as of 07/16/2023       Reactions   Cat Dander    Dog Epithelium (canis Lupus Familiaris)    Gramineae Pollens    Horse Epithelium         Medication List     TAKE these medications    acetaZOLAMIDE 250 MG tablet Commonly known as: DIAMOX Take 2 tablets (500 mg total) by mouth 2 (two) times daily.   FLUoxetine 20 MG tablet Commonly known as: PROZAC Take 3 tablets (60 mg total) by mouth daily. What changed: how much to take   fluticasone 50 MCG/ACT nasal spray Commonly known as: FLONASE Place 2 sprays into both nostrils daily as needed for allergies or rhinitis.   metFORMIN 500 MG 24 hr tablet Commonly known as: GLUCOPHAGE-XR Take 1,000 mg by mouth 2 (two) times daily.   methylphenidate 54 MG CR tablet Commonly known as: Concerta Take 1 tablet (54 mg total) by mouth every morning.   methylphenidate 54 MG CR tablet Commonly known as: Concerta Take 1 tablet (54 mg total) by mouth every morning.   montelukast 10 MG tablet Commonly known as: SINGULAIR Take 10 mg by mouth at bedtime.   Qvar RediHaler 80 MCG/ACT inhaler Generic drug: beclomethasone Inhale 2 puffs into the lungs 2 (two) times daily.   VENTOLIN IN Inhale 2 puffs into the lungs every 4 (four)  hours as needed (shortness of breath).   Vitamin D3 50 MCG (2000 UT) capsule Take 2,000 Units by mouth 2 (two) times daily.        Follow-up Information     Earleen Newport, NP Follow up in 1 week(s).   Specialty: Pediatrics Why: Office closed at this time patient to make own follow up appt Contact information: [address] [place] [facility] [place] [address] [phone]         Lonell Face, MD Follow up in 2 week(s).   Specialty: Neurology Why: Office closed  at this time patient to make own follow up appt Contact information: [address] [facility] [facility] [address] [facility] [place] [address] [phone]                Discharge Exam: Filed Weights   07/13/23 1557 07/13/23 1611  Weight: 108.9 kg 108.8 kg      07/16/2023    8:37 AM 07/16/2023    7:33 AM 07/16/2023    4:59 AM  Vitals with BMI  Systolic  120 120  Diastolic  73 72  Pulse 105 101 84    General - Young  morbidly obese African American female, no apparent distress HEENT - PERRLA, EOMI, atraumatic head, non tender sinuses. Lung - distant, no rales, rhonchi, wheezes. Heart - S1, S2 heard, no murmurs, rubs, no pedal edema. Abdomen - Soft, non tender, bowel sounds good Neuro - Alert, awake and oriented x 3, non focal exam. Skin - Warm and dry.    Condition at discharge: stable  The results of significant diagnostics from this hospitalization (including imaging, microbiology, ancillary and laboratory) are listed below for reference.   Imaging Studies: DG FL GUIDED THERAPEUTIC LUMBAR PUNCTURE Result Date: 07/14/2023 CLINICAL DATA:  Provided history: Papilledema. Request received for therapeutic lumbar puncture. EXAM: LUMBAR PUNCTURE UNDER FLUOROSCOPY PROCEDURE: Alwyn Ren, NP obtained informed consent from the patient prior to the procedure. This process included a discussion of procedure risks. The patient was positioned prone on the fluoroscopy table. An appropriate skin entry site was determined under fluoroscopy and marked. The operator donned sterile gloves and a mask. The lower back was prepped and draped in the usual sterile fashion. Local anesthesia was provided with 1% lidocaine. Under intermittent fluoroscopy, lumbar puncture was performed at the L4-L5 level using a 20 gauge spinal needle with return of clear/colorless CSF. There was an opening pressure of greater than 65 cm water (exceeding the height of the spinal manometer). 18 mL of CSF were  removed. There was a closing pressure of 15 cm water. The inner stylet was replaced within the needle and the needle was removed in its entirety. A dressing was applied at the skin entry site. The patient tolerated the procedure well and no immediate post-procedure complication was apparent. The procedure was performed by Alwyn Ren, NP, supervised by Dr. Jackey Loge. FLUOROSCOPY: Radiation Exposure Index (as provided by the fluoroscopic device): 43.20 mGy Kerma IMPRESSION: 1. Technically successful fluoroscopically-guided L4-L5 lumbar puncture. 2. Opening pressure: > 65 cm water. 3. 18 mL of CSF removed. 4. Closing pressure: 15 cm water. 5. No immediate post-procedure complication. Electronically Signed   By: Jackey Loge D.O.   On: 07/14/2023 10:03   MR Brain W and Wo Contrast Result Date: 07/13/2023 CLINICAL DATA:  Central vision loss.  Diplopia. EXAM: MRI HEAD WITHOUT AND WITH CONTRAST TECHNIQUE: Multiplanar, multiecho pulse sequences of the brain and surrounding structures were obtained without and with intravenous contrast. CONTRAST:  10mL GADAVIST  GADOBUTROL 1 MMOL/ML IV SOLN COMPARISON:  12/29/2017 FINDINGS: Brain: No acute infarct, mass effect or extra-axial collection. No acute or chronic hemorrhage. Normal white matter signal, parenchymal volume and CSF spaces. The midline structures are normal. There is no abnormal contrast enhancement. Vascular: Normal flow voids. Possible narrowing of the midportion of the left transverse sinus. Skull and upper cervical spine: Normal calvarium and skull base. Visualized upper cervical spine and soft tissues are normal. Sinuses/Orbits:Papilledema is visible on the left. Mildly increased amount of CSF in the optic nerve sheaths, relatively equivocal. IMPRESSION: 1. Papilledema, mild optic nerve sheath dilatation and possible narrowing of the midportion of the left transverse sinus. These findings are nonspecific but can be seen in the setting of idiopathic  intracranial hypertension. 2. Otherwise normal MRI of the brain. Electronically Signed   By: Deatra Robinson M.D.   On: 07/13/2023 19:28    Microbiology: Results for orders placed or performed during the hospital encounter of 12/29/17  CSF culture     Status: None   Collection Time: 12/29/17  2:45 PM   Specimen: CSF; Cerebrospinal Fluid  Result Value Ref Range Status   Specimen Description CSF  Final   Special Requests NONE  Final   Gram Stain NO WBC SEEN NO ORGANISMS SEEN CYTOSPIN SMEAR   Final   Culture   Final    NO GROWTH 3 DAYS Performed at [facility], [address], [place], [place] [address]    Report Status 01/02/2018 FINAL  Final    Labs: CBC: Recent Labs  Lab 07/13/23 1545 07/14/23 0450  WBC 9.1 9.0  NEUTROABS 4.9  --   HGB 14.0 13.2  HCT 42.5 39.0  MCV 79.3* 78.8*  PLT 290 249   Basic Metabolic Panel: Recent Labs  Lab 07/13/23 1545 07/14/23 0450  NA 138 136  K 4.4 3.7  CL 106 104  CO2 23 21*  GLUCOSE 103* 101*  BUN 12 12  CREATININE 0.97 0.99  CALCIUM 9.6 9.4  MG  --  2.4   Liver Function Tests: No results for input(s): "AST", "ALT", "ALKPHOS", "BILITOT", "PROT", "ALBUMIN" in the last 168 hours. CBG: No results for input(s): "GLUCAP" in the last 168 hours.  Discharge time spent: 35 minutes.  Signed: Marcelino Duster, MD [facility] 07/16/2023

## 2023-07-16 NOTE — Plan of Care (Signed)

## 2023-09-06 ENCOUNTER — Telehealth: Payer: BC Managed Care – PPO | Admitting: Child and Adolescent Psychiatry

## 2023-09-06 DIAGNOSIS — F900 Attention-deficit hyperactivity disorder, predominantly inattentive type: Secondary | ICD-10-CM | POA: Diagnosis not present

## 2023-09-06 DIAGNOSIS — F3341 Major depressive disorder, recurrent, in partial remission: Secondary | ICD-10-CM

## 2023-09-06 DIAGNOSIS — F418 Other specified anxiety disorders: Secondary | ICD-10-CM | POA: Diagnosis not present

## 2023-09-06 MED ORDER — METHYLPHENIDATE HCL ER (OSM) 54 MG PO TBCR: 54.0000 mg | EXTENDED_RELEASE_TABLET | ORAL | 0 refills | Status: DC

## 2023-09-06 NOTE — Progress Notes (Signed)
 Virtual Visit via Video Note  I connected with Anne Ball on 09/06/23 at  9:00 AM EDT by a video enabled telemedicine application and verified that I am speaking with the correct person using two identifiers.  Location: Patient: home Provider: home office   I discussed the limitations of evaluation and management by telemedicine and the availability of in person appointments. The patient expressed understanding and agreed to proceed.    I discussed the assessment and treatment plan with the patient. The patient was provided an opportunity to ask questions and all were answered. The patient agreed with the plan and demonstrated an understanding of the instructions.   The patient was advised to call back or seek an in-person evaluation if the symptoms worsen or if the condition fails to improve as anticipated.    Anne Smalling, MD  [facility] MD/PA/NP OP Progress Note  09/06/23 9:00 AM Anne Ball  MRN:  [id number]  Chief Complaint:  Medication management follow-up for anxiety, depression, ADHD.  HPI: This is a 23 year old African-American female with psychiatric history significant for major depressive disorder, anxiety, ADHD, ASD was seen and evaluated over telemedicine for medication management follow-up.    Her chart was reviewed prior to her evaluation today and according to chart review, she was admitted to inpatient medicine unit for recurrence of pseudotumor cerebri.  She was subsequently discharged with recommendations to continue with Diamox 500 mg twice daily, follow up with ophthalmology and neurology.  Anne Ball corroborated that she was admitted to the hospital because of pseudotumor cerebri, she was having a lot of headaches and double vision.  She reported that her symptoms improved during the hospitalization following lumbar puncture and medication treatment.  She already has seen ophthalmologist and they are pleased with her progress, and reported that she does not have  any swelling around her optic nerve.  She reported that since her discharge from the hospital, she has been consistently taking her medications.  Her mother was helping her initially and now she has been taking her medications by herself.  Along with Diamox, she is taking her fluoxetine and methylphenidate as well.  She denies any problems with her medications and reported that overall she has been feeling better.  She reported that she is not feeling anxious, took GED tests recently and waiting only for 1 results to come back.  She is also considering other [employer] classes after she completes the GED.  She reported that her mood has been good, denied anhedonia, has been sleeping and eating well.  She denied SI or HI, denied substance abuse.  With her verbal informed consent I spoke with her mother to obtain collateral information and discuss her treatment plan.  Mother corroborated history regarding her hospitalization for pseudotumor cerebri as mentioned above.  Mother reported that since she has been taking her medications consistently over the last 2 months, she has done well with with her mental health 2.  She reported the patient is more engaging, more motivated to do things and past 4 out of 5 GED tests.  We discussed treatment mannerisms to her current medications and follow up again in about 2 months or earlier if needed.  Visit Diagnosis:    ICD-10-CM   1. Other specified anxiety disorders  F41.8     2. Recurrent major depressive disorder, in partial remission (HCC)  F33.41     3. Attention deficit hyperactivity disorder (ADHD), predominantly inattentive type  F90.0 methylphenidate (CONCERTA) 54 MG PO CR tablet  methylphenidate (CONCERTA) 54 MG PO CR tablet       Past Psychiatric History: As mentioned in initial H&P, reviewed today, no change. She continues to take Prozac 40 mg once a day, Concerta 54 mg once a day and follow-up with individual therapist, started following Mr. Anne Ball. Past Medical History:  Past Medical History:  Diagnosis Date   Anxiety    Asthma    Depression    Fifth nerve palsy    Papilledema associated with increased intracranial pressure    Pseudotumor cerebri     Past Surgical History:  Procedure Laterality Date   NO PAST SURGERIES      Family Psychiatric History: As mentioned in initial H&P, reviewed today, no change  Family History:  Family History  Problem Relation Age of Onset   Anxiety disorder Mother    Depression Mother    Diabetes Mother    Hypertension Mother    Hyperlipidemia Mother    Asthma Father    Migraines Neg Hx    Seizures Neg Hx    Autism Neg Hx    ADD / ADHD Neg Hx    Bipolar disorder Neg Hx    Schizophrenia Neg Hx     Social History:  Social History   Socioeconomic History   Marital status: Single    Spouse name: Not on file   Number of children: Not on file   Years of education: Not on file   Highest education level: 10th grade  Occupational History   Not on file  Tobacco Use   Smoking status: Never   Smokeless tobacco: Never  Vaping Use   Vaping status: Never Used  Substance and Sexual Activity   Alcohol use: Never   Drug use: Never   Sexual activity: Never  Other Topics Concern   Not on file  Social History Narrative   Lives with mom, dad and brother. She is in the 12th grade at [employer]. She enjoys eating, sleeping, and drawing.   Social Drivers of Corporate investment banker Strain: Low Risk  (07/03/2023)   Received from [facility]   Overall Financial Resource Strain (CARDIA)    Difficulty of Paying Living Expenses: Not very hard  Food Insecurity: No Food Insecurity (07/14/2023)   Hunger Vital Sign    Worried About Running Out of Food in the Last Year: Never true    Ran Out of Food in the Last Year: Never true  Transportation Needs: No Transportation Needs (07/14/2023)   PRAPARE - Administrator, Civil Service (Medical): No    Lack  of Transportation (Non-Medical): No  Physical Activity: Inactive (09/12/2018)   Exercise Vital Sign    Days of Exercise per Week: 0 days    Minutes of Exercise per Session: 0 min  Stress: Stress Concern Present (09/12/2018)   [employer] of Occupational Health - Occupational Stress Questionnaire    Feeling of Stress : Rather much  Social Connections: Moderately Isolated (07/14/2023)   Social Connection and Isolation Panel [NHANES]    Frequency of Communication with Friends and Family: Twice a week    Frequency of Social Gatherings with Friends and Family: More than three times a week    Attends Religious Services: 1 to 4 times per year    Active Member of [employer] or Organizations: No    Attends Banker Meetings: Never    Marital Status: Never married    Allergies:  Allergies  Allergen Reactions  Cat Dander    Dog Epithelium (Canis Lupus Familiaris)    Gramineae Pollens    Horse Epithelium     Metabolic Disorder Labs: Lab Results  Component Value Date   HGBA1C 5.7 08/19/2011   No results found for: "PROLACTIN" Lab Results  Component Value Date   CHOL 109 08/19/2011   TRIG 48 08/19/2011   HDL 35 (L) 08/19/2011   VLDL 10 08/19/2011   LDLCALC 64 08/19/2011   Lab Results  Component Value Date   TSH 1.065 12/29/2017    Therapeutic Level Labs: No results found for: "LITHIUM" No results found for: "VALPROATE" No results found for: "CBMZ"  Current Medications: Current Outpatient Medications  Medication Sig Dispense Refill   acetaZOLAMIDE (DIAMOX) 250 MG tablet Take 2 tablets (500 mg total) by mouth 2 (two) times daily. 120 tablet 1   Albuterol (VENTOLIN IN) Inhale 2 puffs into the lungs every 4 (four) hours as needed (shortness of breath).      Cholecalciferol (VITAMIN D3) 50 MCG (2000 UT) capsule Take 2,000 Units by mouth 2 (two) times daily. (Patient not taking: Reported on 07/14/2023)     FLUoxetine (PROZAC) 20 MG tablet Take 3 tablets (60 mg total) by  mouth daily. (Patient taking differently: Take 20 mg by mouth daily.) 90 tablet 1   fluticasone (FLONASE) 50 MCG/ACT nasal spray Place 2 sprays into both nostrils daily as needed for allergies or rhinitis.  3   metFORMIN (GLUCOPHAGE-XR) 500 MG 24 hr tablet Take 1,000 mg by mouth 2 (two) times daily. (Patient not taking: Reported on 07/14/2023)     methylphenidate (CONCERTA) 54 MG PO CR tablet Take 1 tablet (54 mg total) by mouth every morning. 30 tablet 0   methylphenidate (CONCERTA) 54 MG PO CR tablet Take 1 tablet (54 mg total) by mouth every morning. 30 tablet 0   montelukast (SINGULAIR) 10 MG tablet Take 10 mg by mouth at bedtime. (Patient not taking: Reported on 07/14/2023)     QVAR REDIHALER 80 MCG/ACT inhaler Inhale 2 puffs into the lungs 2 (two) times daily. (Patient not taking: Reported on 07/14/2023)     No current facility-administered medications for this visit.     Musculoskeletal: Strength & Muscle Tone: unable to assess since visit was over the telemedicine.  Gait & Station: unable to assess since visit was over the telemedicine.  Patient leans: N/A  Psychiatric Specialty Exam: There were no vitals filed for this visit.    There is no height or weight on file to calculate BMI.  Mental Status Exam: Appearance: casually dressed; well groomed; no overt signs of trauma or distress noted Attitude: calm, cooperative with good eye contact Activity: No PMA/PMR, no tics/no tremors; no EPS noted  Speech: normal rate, rhythm and volume Thought Process: Logical, linear, and goal-directed.  Associations: no looseness, tangentiality, circumstantiality, flight of ideas, thought blocking or word salad noted Thought Content: (abnormal/psychotic thoughts): no abnormal or delusional thought process evidenced SI/HI: denies Si/Hi Perception: no illusions or visual/auditory hallucinations noted; no response to internal stimuli demonstrated Mood & Affect: "good"/full range, neutral Judgment &  Insight: both fair Attention and Concentration : Good Cognition : WNL Language : Good ADL - Intact       Screenings: GAD-7    Flowsheet Row Office Visit from 01/03/2022 in [facility]  Total GAD-7 Score 17      PHQ2-9    Flowsheet Row Office Visit from 01/03/2022 in [facility] Office Visit  from 09/27/2021 in [facility] Office Visit from 08/16/2021 in [facility] Nutrition from 07/01/2021 in [facility] Total Score 5 3 2  0  PHQ-9 Total Score 20 12 12  --      Flowsheet Row ED to Hosp-Admission (Discharged) from 07/13/2023 in [facility] MEDICAL TELEMETRY Office Visit from 01/03/2022 in [facility] Office Visit from 09/27/2021 in [facility]  C-SSRS RISK CATEGORY No Risk No Risk No Risk        Assessment and Plan:   This is a 23 year old African-American female with psychiatric history significant for major depressive disorder, anxiety, ADHD, ASD.  She appears to be more adherent to her medications, appears to have improvement with motivation, organization and engaging more with family.  Mood and anxiety remains stable.  Recommended to continue with current medications as mentioned below in the plan.    Plan as below.  #1 Depression (recurrent, in partial remission) -Take Prozac 20 mg daily - She has supportive parents which is a good prognostic and protective factor.     #2 Anxiety (stable) - Her presentation appears most consistent with generalized anxiety and social anxiety disorders. - Recommending medication and therapy as mentioned above.    #3 ADHD (chronic and stable) -  Continue with Concerta 54 mg once a day   #4 Autism (Chronic,  stable) - Testing done at [name], and diagnosed with ASD per report.  -  Currently in career college course at [employer].    #5 Pseudotumor Cerbri (stable per pt) - Defer management to neuro and opthalmology.  - Following with endo and nutrition service due to obesity and prediabetes.  - On metformin now however also does not seem to be adherent to it.            Pilar Bridge, MD 09/06/23, 9:00 am

## 2023-09-25 ENCOUNTER — Telehealth: Payer: Self-pay

## 2023-09-25 NOTE — Telephone Encounter (Signed)
 pt mother called asked if you would give a month supply of the diamox  250mg . that it was given in the er and they will not fill it and her appt with the provider is in 3 weeks . pt was last seen on 4-16 next appt 6-18

## 2023-09-25 NOTE — Telephone Encounter (Signed)
 Pt mother notified.

## 2023-09-25 NOTE — Telephone Encounter (Signed)
 Hi please let her know that it is beyond my expertise. She should be able to get refill from Dr. Reza(her previous neurologist) while she is establishing services with adult neurology or her PCP. Please let me know after you speak with her. Thanks

## 2023-11-08 ENCOUNTER — Telehealth (INDEPENDENT_AMBULATORY_CARE_PROVIDER_SITE_OTHER): Admitting: Child and Adolescent Psychiatry

## 2023-11-08 DIAGNOSIS — F3341 Major depressive disorder, recurrent, in partial remission: Secondary | ICD-10-CM | POA: Diagnosis not present

## 2023-11-08 DIAGNOSIS — F840 Autistic disorder: Secondary | ICD-10-CM | POA: Diagnosis not present

## 2023-11-08 DIAGNOSIS — F418 Other specified anxiety disorders: Secondary | ICD-10-CM

## 2023-11-08 DIAGNOSIS — F900 Attention-deficit hyperactivity disorder, predominantly inattentive type: Secondary | ICD-10-CM

## 2023-11-08 MED ORDER — METHYLPHENIDATE HCL ER (OSM) 54 MG PO TBCR
54.0000 mg | EXTENDED_RELEASE_TABLET | ORAL | 0 refills | Status: DC
Start: 2023-11-08 — End: 2024-03-26

## 2023-11-08 MED ORDER — FLUOXETINE HCL 20 MG PO TABS: 20.0000 mg | ORAL_TABLET | Freq: Every day | ORAL | 2 refills | Status: DC

## 2023-11-08 MED ORDER — METHYLPHENIDATE HCL ER (OSM) 54 MG PO TBCR: 54.0000 mg | EXTENDED_RELEASE_TABLET | ORAL | 0 refills | Status: DC

## 2023-11-08 NOTE — Progress Notes (Signed)
 Virtual Visit via Video Note  I connected with Anne Ball on 11/08/23 at  9:00 AM EDT by a video enabled telemedicine application and verified that I am speaking with the correct person using two identifiers.  Location: Patient: home Provider: home office   I discussed the limitations of evaluation and management by telemedicine and the availability of in person appointments. The patient expressed understanding and agreed to proceed.    I discussed the assessment and treatment plan with the patient. The patient was provided an opportunity to ask questions and all were answered. The patient agreed with the plan and demonstrated an understanding of the instructions.   The patient was advised to call back or seek an in-person evaluation if the symptoms worsen or if the condition fails to improve as anticipated.    Anne Bridge, MD  [facility] MD/PA/NP OP Progress Note  11/08/23 9:00 AM [name]  MRN:  [id number]  Chief Complaint:  Medication management follow-up for anxiety, depression and ADHD.  HPI: This is a 23 year old African-American female with psychiatric history significant for major depressive disorder, anxiety, ADHD, ASD was seen and evaluated over telemedicine for medication management follow-up.    Her chart was reviewed prior to her evaluation today and according to chart review, she followed up with neurologist and was continued on acetazolamide  500 mg.  During the evaluation today, she reported that she has been doing great, she recently went to [place] with her cousin and although at times she was anxious she was able to manage her anxiety, and reported that her mood has been good, denied any low lows or depressed mood, and other depressive symptoms.  She reported that she has been spending time relaxing, completed her GED, and plans to start [employer] certificate course this fall.  She denied SI or HI, reported that she has been eating and sleeping well.  She also tells  me that she has been taking care of herself, keeping her room clean and has been very compliant with her medications.  She currently takes fluoxetine  20 mg daily and Concerta  54 mg daily.  I discussed with her about my transition from the clinic and she agreed to see psychiatry nurse practitioner for her next appointment who will continue to follow up with her.  With her verbal informed consent I spoke with her mother, who tells me that she has been doing well, has been compliant with her medications, completed her GED and they are working on her getting to [facility]  She denied any new concerns for today's appointment.  I also discussed with her regarding the transition from clinic and she agreed as well to see psychiatry nurse practitioner at the next appointment in person.  Front desk was notified to call mother and schedule an appointment.   Visit Diagnosis:    ICD-10-CM   1. Other specified anxiety disorders  F41.8     2. Recurrent major depressive disorder, in partial remission (HCC)  F33.41     3. Attention deficit hyperactivity disorder (ADHD), predominantly inattentive type  F90.0     4. Autism spectrum disorder  F84.0         Past Psychiatric History: As mentioned in initial H&P, reviewed today, no change. She continues to take Prozac  40 mg once a day, Concerta  54 mg once a day and follow-up with individual therapist, started following Anne Ball. Past Medical History:  Past Medical History:  Diagnosis Date   Anxiety    Asthma  Depression    Fifth nerve palsy    Papilledema associated with increased intracranial pressure    Pseudotumor cerebri     Past Surgical History:  Procedure Laterality Date   NO PAST SURGERIES      Family Psychiatric History: As mentioned in initial H&P, reviewed today, no change  Family History:  Family History  Problem Relation Age of Onset   Anxiety disorder Mother    Depression Mother    Diabetes Mother    Hypertension Mother     Hyperlipidemia Mother    Asthma Father    Migraines Neg Hx    Seizures Neg Hx    Autism Neg Hx    ADD / ADHD Neg Hx    Bipolar disorder Neg Hx    Schizophrenia Neg Hx     Social History:  Social History   Socioeconomic History   Marital status: Single    Spouse name: Not on file   Number of children: Not on file   Years of education: Not on file   Highest education level: 10th grade  Occupational History   Not on file  Tobacco Use   Smoking status: Never   Smokeless tobacco: Never  Vaping Use   Vaping status: Never Used  Substance and Sexual Activity   Alcohol use: Never   Drug use: Never   Sexual activity: Never  Other Topics Concern   Not on file  Social History Narrative   Lives with mom, dad and brother. She is in the 12th grade at [employer]. She enjoys eating, sleeping, and drawing.   Social Drivers of Corporate investment banker Strain: Low Risk  (07/03/2023)   Received from [facility]   Overall Financial Resource Strain (CARDIA)    Difficulty of Paying Living Expenses: Not very hard  Food Insecurity: No Food Insecurity (07/14/2023)   Hunger Vital Sign    Worried About Running Out of Food in the Last Year: Never true    Ran Out of Food in the Last Year: Never true  Transportation Needs: No Transportation Needs (07/14/2023)   PRAPARE - Administrator, Civil Service (Medical): No    Lack of Transportation (Non-Medical): No  Physical Activity: Inactive (09/12/2018)   Exercise Vital Sign    Days of Exercise per Week: 0 days    Minutes of Exercise per Session: 0 min  Stress: Stress Concern Present (09/12/2018)   [employer] of Occupational Health - Occupational Stress Questionnaire    Feeling of Stress : Rather much  Social Connections: Moderately Isolated (07/14/2023)   Social Connection and Isolation Panel    Frequency of Communication with Friends and Family: Twice a week    Frequency of Social Gatherings with  Friends and Family: More than three times a week    Attends Religious Services: 1 to 4 times per year    Active Member of [employer] or Organizations: No    Attends Banker Meetings: Never    Marital Status: Never married    Allergies:  Allergies  Allergen Reactions   Cat Dander    Dog Epithelium (Canis Lupus Familiaris)    Gramineae Pollens    Horse Epithelium     Metabolic Disorder Labs: Lab Results  Component Value Date   HGBA1C 5.7 08/19/2011   No results found for: PROLACTIN Lab Results  Component Value Date   CHOL 109 08/19/2011   TRIG 48 08/19/2011   HDL 35 (L) 08/19/2011   VLDL 10  08/19/2011   LDLCALC 64 08/19/2011   Lab Results  Component Value Date   TSH 1.065 12/29/2017    Therapeutic Level Labs: No results found for: LITHIUM No results found for: VALPROATE No results found for: CBMZ  Current Medications: Current Outpatient Medications  Medication Sig Dispense Refill   acetaZOLAMIDE  (DIAMOX ) 250 MG tablet Take 2 tablets (500 mg total) by mouth 2 (two) times daily. 120 tablet 1   Albuterol  (VENTOLIN  IN) Inhale 2 puffs into the lungs every 4 (four) hours as needed (shortness of breath).      Cholecalciferol (VITAMIN D3) 50 MCG (2000 UT) capsule Take 2,000 Units by mouth 2 (two) times daily. (Patient not taking: Reported on 07/14/2023)     FLUoxetine  (PROZAC ) 20 MG tablet Take 3 tablets (60 mg total) by mouth daily. (Patient taking differently: Take 20 mg by mouth daily.) 90 tablet 1   fluticasone (FLONASE) 50 MCG/ACT nasal spray Place 2 sprays into both nostrils daily as needed for allergies or rhinitis.  3   metFORMIN (GLUCOPHAGE-XR) 500 MG 24 hr tablet Take 1,000 mg by mouth 2 (two) times daily. (Patient not taking: Reported on 07/14/2023)     methylphenidate  (CONCERTA ) 54 MG PO CR tablet Take 1 tablet (54 mg total) by mouth every morning. 30 tablet 0   methylphenidate  (CONCERTA ) 54 MG PO CR tablet Take 1 tablet (54 mg total) by mouth every  morning. 30 tablet 0   montelukast (SINGULAIR) 10 MG tablet Take 10 mg by mouth at bedtime. (Patient not taking: Reported on 07/14/2023)     QVAR REDIHALER 80 MCG/ACT inhaler Inhale 2 puffs into the lungs 2 (two) times daily. (Patient not taking: Reported on 07/14/2023)     No current facility-administered medications for this visit.     Musculoskeletal: Strength & Muscle Tone: unable to assess since visit was over the telemedicine.  Gait & Station: unable to assess since visit was over the telemedicine.  Patient leans: N/A  Psychiatric Specialty Exam: There were no vitals filed for this visit.    There is no height or weight on file to calculate BMI.  Mental Status Exam: Appearance: casually dressed; well groomed; no overt signs of trauma or distress noted Attitude: calm, cooperative with good eye contact Activity: No PMA/PMR, no tics/no tremors; no EPS noted  Speech: normal rate, rhythm and volume Thought Process: Logical, linear, and goal-directed.  Associations: no looseness, tangentiality, circumstantiality, flight of ideas, thought blocking or word salad noted Thought Content: (abnormal/psychotic thoughts): no abnormal or delusional thought process evidenced SI/HI: denies Si/Hi Perception: no illusions or visual/auditory hallucinations noted; no response to internal stimuli demonstrated Mood & Affect: good/neutral Judgment & Insight: both fair Attention and Concentration : Good Cognition : WNL Language : Good ADL - Intact       Screenings: GAD-7    Flowsheet Row Office Visit from 01/03/2022 in [facility]  Total GAD-7 Score 17   PHQ2-9    Flowsheet Row Office Visit from 01/03/2022 in [facility] Office Visit from 09/27/2021 in [facility] Office Visit from 08/16/2021 in [facility] Nutrition from 07/01/2021  in [facility] Nutrition & Diabetes Education Services at [facility] Total Score 5 3 2  0  PHQ-9 Total Score 20 12 12  --   Flowsheet Row ED to Hosp-Admission (Discharged) from 07/13/2023 in [facility] 1C MEDICAL TELEMETRY Office Visit from 01/03/2022 in [facility] Office Visit from 09/27/2021 in [facility]  C-SSRS RISK CATEGORY No Risk No Risk No Risk     Assessment and Plan:   This is a 23 year old African-American female with psychiatric history significant for major depressive disorder, anxiety, ADHD, ASD.  Previous foster current medications and she appears to remain compliant with her medications, appears to have stability with mood and anxiety in the community continue with them as mentioned below in the plan.  She will be seeing psychiatry nurse petitioner moving forward for medication management at this clinic.    Plan as below.  #1 Depression (recurrent, in partial remission) -Take Prozac  20 mg daily - She has supportive parents which is a good prognostic and protective factor.     #2 Anxiety (stable) - Her presentation appears most consistent with generalized anxiety and social anxiety disorders. - Recommending medication and therapy as mentioned above.    #3 ADHD (chronic and stable) -  Continue with Concerta  54 mg once a day   #4 Autism (Chronic, stable) - Testing done at [facility], and diagnosed with ASD per report.  -  Currently in career college course at [employer], will start a certificate course this fall.    #5 Pseudotumor Cerbri (stable per pt) - Defer management to neuro and opthalmology.  - Following with endo and nutrition service due to obesity and prediabetes.  - On metformin now however also does not seem to be adherent to it.            Anne Bridge, MD 11/08/23, 9:00 am

## 2023-11-09 ENCOUNTER — Telehealth: Payer: Self-pay

## 2023-11-09 NOTE — Telephone Encounter (Signed)
 Received fax to initiate a Prior Authorization for patients Fluoxetine  20 mg tablets 30 tablets for 30 day supply sent to [facility] via CoverMyMeds   Approved  PA Case ID [id number] Case ID [id number] Coverage 10-10-23//////11-08-24  Patient informed that PA was approved

## 2024-01-09 ENCOUNTER — Encounter: Payer: Self-pay | Admitting: Psychiatry

## 2024-01-09 ENCOUNTER — Ambulatory Visit: Admitting: Psychiatry

## 2024-01-09 VITALS — BP 124/86 | HR 83 | Temp 97.9°F | Ht 59.0 in | Wt 255.0 lb

## 2024-01-09 DIAGNOSIS — F900 Attention-deficit hyperactivity disorder, predominantly inattentive type: Secondary | ICD-10-CM

## 2024-01-09 DIAGNOSIS — F840 Autistic disorder: Secondary | ICD-10-CM | POA: Diagnosis not present

## 2024-01-09 DIAGNOSIS — F418 Other specified anxiety disorders: Secondary | ICD-10-CM | POA: Diagnosis not present

## 2024-01-09 DIAGNOSIS — F3341 Major depressive disorder, recurrent, in partial remission: Secondary | ICD-10-CM

## 2024-01-09 NOTE — Progress Notes (Signed)
 Psychiatric Initial Adult Assessment   Patient Identification: Anne Ball MRN:  [id number] Date of Evaluation:  01/09/2024 Referral Source: [facility] Chief Complaint:   Chief Complaint  Patient presents with   Follow-up   Visit Diagnosis:    ICD-10-CM   1. Other specified anxiety disorders  F41.8     2. Recurrent major depressive disorder, in partial remission (HCC)  F33.41     3. Attention deficit hyperactivity disorder (ADHD), predominantly inattentive type  F90.0     4. Autism spectrum disorder  F84.0       History of Present Illness: A 23 year old female presenting to [facility] with her mother with the permission for her to be included for new patient visit.  Patient reports she is doing well stating that Dr. Sherline is medication schedule she is on is doing very well.  Currently patient is taking fluoxetine  20 mg once daily and Concerta  54 mg once daily.  Patient reports that she is doing well stating that she has started  [employer] and is looking forward to starting school and is very happy about staying focused on school.  Patient states she is not currently working at this time.  Patient is denying any big stressors at this time reporting a 5 out of 10 mood with 10 being the best mood possible.  Patient reports that she is doing well and states that life is simple at this time and states that there is no big obligations at this time that is causing her stress.  Patient reports that no depressive episodes at this time stating that is manageable reporting 8 on the GAD-7 and 12 on the PHQ-9.  Patient reports that she is looking forward to continuing school and states that is all she takes her medication as she believes she will do okay.  Patient has been educated if her symptoms become worse for whatever reason to please reach this provider by calling the clinic or utilizing my chart messaging.  Patient has been scheduled for 3 months out but has been encouraged that if something should come  up to please schedule a sooner appointment.  Patient verbalized understanding and her mother verbalized understanding.  Mother states that she has no concerns at this time stating that the patient is doing well and that she has no feedback regarding the current medication schedule.  Based on this assessment interview is recommended for the patient to continue medications of fluoxetine  20 mg once daily and Concerta  54 mg once daily.  Patient currently denies SI, HI, AVH.  Patient is in agreement with treatment plan.  Patient with no other questions or concerns.  Patient will follow up in 3 months as patient is under maintenance schedule.  Associated Signs/Symptoms: Depression Symptoms:  difficulty concentrating, (Hypo) Manic Symptoms: Negative Anxiety Symptoms: Negative Psychotic Symptoms: Negative PTSD Symptoms: Negative  Past Psychiatric History:  Inpatient: None, One ER visit for depression in 06/2018 at [facility] RTC: None Outpatient:     - Meds: At present takes Prozac  20 mg daily    - Therapy: Started seeing Ms. Lorrene for therapy Hx of SI/HI: No hx of SA or suicidal ideations. No hx of HI.     Previous Psychotropic Medications: Yes   Substance Abuse History in the last 12 months:  No.  Consequences of Substance Abuse: Negative  Past Medical History:  Past Medical History:  Diagnosis Date   Anxiety    Asthma    Depression    Fifth nerve palsy    Papilledema associated  with increased intracranial pressure    Pseudotumor cerebri     Past Surgical History:  Procedure Laterality Date   NO PAST SURGERIES      Family Psychiatric History: No additional  Family History:  Family History  Problem Relation Age of Onset   Anxiety disorder Mother    Depression Mother    Diabetes Mother    Hypertension Mother    Hyperlipidemia Mother    Asthma Father    Migraines Neg Hx    Seizures Neg Hx    Autism Neg Hx    ADD / ADHD Neg Hx    Bipolar disorder Neg Hx    Schizophrenia Neg Hx      Social History:   Social History   Socioeconomic History   Marital status: Single    Spouse name: Not on file   Number of children: Not on file   Years of education: Not on file   Highest education level: 10th grade  Occupational History   Not on file  Tobacco Use   Smoking status: Never   Smokeless tobacco: Never  Vaping Use   Vaping status: Never Used  Substance and Sexual Activity   Alcohol use: Never   Drug use: Never   Sexual activity: Never  Other Topics Concern   Not on file  Social History Narrative   Lives with mom, dad and brother. She is in the 12th grade at [employer]. She enjoys eating, sleeping, and drawing.   Social Drivers of Corporate investment banker Strain: Low Risk  (07/03/2023)   Received from [facility]   Overall Financial Resource Strain (CARDIA)    Difficulty of Paying Living Expenses: Not very hard  Food Insecurity: No Food Insecurity (07/14/2023)   Hunger Vital Sign    Worried About Running Out of Food in the Last Year: Never true    Ran Out of Food in the Last Year: Never true  Transportation Needs: No Transportation Needs (07/14/2023)   PRAPARE - Administrator, Civil Service (Medical): No    Lack of Transportation (Non-Medical): No  Physical Activity: Inactive (09/12/2018)   Exercise Vital Sign    Days of Exercise per Week: 0 days    Minutes of Exercise per Session: 0 min  Stress: Stress Concern Present (09/12/2018)   [employer] of Occupational Health - Occupational Stress Questionnaire    Feeling of Stress : Rather much  Social Connections: Moderately Isolated (07/14/2023)   Social Connection and Isolation Panel    Frequency of Communication with Friends and Family: Twice a week    Frequency of Social Gatherings with Friends and Family: More than three times a week    Attends Religious Services: 1 to 4 times per year    Active Member of [employer] or Organizations: No    Attends Tax inspector Meetings: Never    Marital Status: Never married    Additional Social History: No additional  Allergies:   Allergies  Allergen Reactions   Cat Dander    Dog Epithelium (Canis Lupus Familiaris)    Gramineae Pollens    Horse Epithelium     Metabolic Disorder Labs: Lab Results  Component Value Date   HGBA1C 5.7 08/19/2011   No results found for: PROLACTIN Lab Results  Component Value Date   CHOL 109 08/19/2011   TRIG 48 08/19/2011   HDL 35 (L) 08/19/2011   VLDL 10 08/19/2011   LDLCALC 64 08/19/2011   Lab Results  Component Value Date   TSH 1.065 12/29/2017    Therapeutic Level Labs: No results found for: LITHIUM No results found for: CBMZ No results found for: VALPROATE  Current Medications: Current Outpatient Medications  Medication Sig Dispense Refill   acetaZOLAMIDE  (DIAMOX ) 250 MG tablet Take 2 tablets (500 mg total) by mouth 2 (two) times daily. 120 tablet 1   Albuterol  (VENTOLIN  IN) Inhale 2 puffs into the lungs every 4 (four) hours as needed (shortness of breath).      Cholecalciferol (VITAMIN D3) 50 MCG (2000 UT) capsule Take 2,000 Units by mouth 2 (two) times daily.     FLUoxetine  (PROZAC ) 20 MG tablet Take 1 tablet (20 mg total) by mouth daily. 30 tablet 2   fluticasone (FLONASE) 50 MCG/ACT nasal spray Place 2 sprays into both nostrils daily as needed for allergies or rhinitis.  3   metFORMIN (GLUCOPHAGE-XR) 500 MG 24 hr tablet Take 1,000 mg by mouth 2 (two) times daily.     methylphenidate  (CONCERTA ) 54 MG PO CR tablet Take 1 tablet (54 mg total) by mouth every morning. 30 tablet 0   montelukast (SINGULAIR) 10 MG tablet Take 10 mg by mouth at bedtime.     QVAR REDIHALER 80 MCG/ACT inhaler Inhale 2 puffs into the lungs 2 (two) times daily.     No current facility-administered medications for this visit.    Musculoskeletal: Strength & Muscle Tone: within normal limits Gait & Station: normal Patient leans: N/A  Psychiatric  Specialty Exam: Review of Systems  Constitutional: Negative.   HENT: Negative.    Eyes: Negative.   Respiratory: Negative.    Cardiovascular: Negative.   Gastrointestinal: Negative.   Endocrine: Negative.   Genitourinary: Negative.   Musculoskeletal: Negative.   Skin: Negative.   Allergic/Immunologic: Negative.   Neurological: Negative.   Hematological: Negative.   Psychiatric/Behavioral:  Positive for dysphoric mood. The patient is nervous/anxious.     Blood pressure 124/86, pulse 83, temperature 97.9 F (36.6 C), temperature source Temporal, height 4' 11 (1.499 m), weight 255 lb (115.7 kg), last menstrual period 01/02/2024, SpO2 100%.Body mass index is 51.5 kg/m.  General Appearance: Well Groomed  Eye Contact:  Good  Speech:  Clear and Coherent  Volume:  Normal  Mood:  Anxious and Depressed  Affect:  Negative  Thought Process:  Coherent  Orientation:  Full (Time, Place, and Person)  Thought Content:  Logical  Suicidal Thoughts:  No  Homicidal Thoughts:  No  Memory:  Immediate;   Good Recent;   Good Remote;   Good  Judgement:  Good  Insight:  Good  Psychomotor Activity:  Normal  Concentration:  Concentration: Good and Attention Span: Good  Recall:  Good  Fund of Knowledge:Good  Language: Good  Akathisia:  No  Handed:  Right  AIMS (if indicated):    Assets:  Desire for Improvement Financial Resources/Insurance Housing  ADL's:  Intact  Cognition: WNL  Sleep:  Good   Screenings: GAD-7    Flowsheet Row Office Visit from 01/03/2022 in confidential department  Total GAD-7 Score 17   PHQ2-9    Flowsheet Row Office Visit from 01/03/2022 in confidential department Office Visit from 09/27/2021 in confidential department Office Visit from 08/16/2021 in confidential department Nutrition from 07/01/2021 in [facility] Nutrition & Diabetes Education Services at [facility] Total Score 5 3 2  0  PHQ-9 Total Score 20 12 12  --   Flowsheet Row ED to Hosp-Admission  (Discharged) from 07/13/2023 in [facility] 1C  MEDICAL TELEMETRY Office Visit from 01/03/2022 in confidential department Office Visit from 09/27/2021 in confidential department  C-SSRS RISK CATEGORY No Risk No Risk No Risk    Assessment and Plan:  This is a 23 year old African-American female with psychiatric history significant for major depressive disorder, anxiety, ADHD, ASD.  Previous foster current medications and she appears to remain compliant with her medications, appears to have stability with mood and anxiety in the community continue with them as mentioned below in the plan.  She will be seeing psychiatry nurse petitioner moving forward for medication management at this clinic.     Plan as below.   #1 Depression (recurrent, in partial remission) -Take Prozac  20 mg daily - She has supportive parents which is a good prognostic and protective factor.     #2 Anxiety (stable) - Her presentation appears most consistent with generalized anxiety and social anxiety disorders. - Recommending medication and therapy as mentioned above.    #3 ADHD (chronic and stable) -  Continue with Concerta  54 mg once a day    #4 Autism (Chronic, stable) - Testing done at [facility], and diagnosed with ASD per report.  -  Currently in career college course at [employer], will start a certificate course this fall.    #5 Pseudotumor Cerbri (stable per pt) - Defer management to neuro and opthalmology.  - Following with endo and nutrition service due to obesity and prediabetes.  - On metformin now however also does not seem to be adherent to it.       Patient/Guardian was advised Release of Information must be obtained prior to any record release in order to collaborate their care with an outside provider. Patient/Guardian was advised if they have not already done so to contact the registration department to sign all necessary forms in order for us  to release information regarding their  care.   Consent: Patient/Guardian gives verbal consent for treatment and assignment of benefits for services provided during this visit. Patient/Guardian expressed understanding and agreed to proceed.   Dorn Jama Der, NP 8/19/20253:13 PM

## 2024-02-15 ENCOUNTER — Other Ambulatory Visit: Payer: Self-pay | Admitting: Psychiatry

## 2024-02-15 ENCOUNTER — Other Ambulatory Visit: Payer: Self-pay | Admitting: Child and Adolescent Psychiatry

## 2024-02-15 DIAGNOSIS — F3341 Major depressive disorder, recurrent, in partial remission: Secondary | ICD-10-CM

## 2024-02-15 DIAGNOSIS — F418 Other specified anxiety disorders: Secondary | ICD-10-CM

## 2024-02-15 MED ORDER — FLUOXETINE HCL 20 MG PO TABS: 20.0000 mg | ORAL_TABLET | Freq: Every day | ORAL | 2 refills | Status: AC

## 2024-02-15 NOTE — Telephone Encounter (Signed)
 Refilled

## 2024-02-15 NOTE — Telephone Encounter (Signed)
 noted

## 2024-03-26 ENCOUNTER — Telehealth: Payer: Self-pay

## 2024-03-26 ENCOUNTER — Other Ambulatory Visit: Payer: Self-pay | Admitting: Psychiatry

## 2024-03-26 DIAGNOSIS — F900 Attention-deficit hyperactivity disorder, predominantly inattentive type: Secondary | ICD-10-CM

## 2024-03-26 MED ORDER — METHYLPHENIDATE HCL ER (OSM) 54 MG PO TBCR: 54.0000 mg | EXTENDED_RELEASE_TABLET | ORAL | 0 refills | Status: DC

## 2024-03-26 NOTE — Telephone Encounter (Signed)
 Medication refill - Call message from patients Mother with request for a new Concerta  54 mg order, last provided 11/08/23. Patient last seen 01/09/24 and returns next on 04/05/24.

## 2024-03-27 NOTE — Telephone Encounter (Signed)
 Medication management - Call message left for pt's Mother, on her identifiable voicemail, that Dorn Der, NP had sent in a new Concerta  54 mg order to their [facility] in [facility] as requested and to call back if any issues filling as prescribed.

## 2024-04-05 ENCOUNTER — Telehealth (INDEPENDENT_AMBULATORY_CARE_PROVIDER_SITE_OTHER): Admitting: Psychiatry

## 2024-04-05 DIAGNOSIS — F900 Attention-deficit hyperactivity disorder, predominantly inattentive type: Secondary | ICD-10-CM

## 2024-04-05 DIAGNOSIS — F840 Autistic disorder: Secondary | ICD-10-CM

## 2024-04-05 DIAGNOSIS — F3341 Major depressive disorder, recurrent, in partial remission: Secondary | ICD-10-CM

## 2024-04-05 DIAGNOSIS — F419 Anxiety disorder, unspecified: Secondary | ICD-10-CM | POA: Diagnosis not present

## 2024-04-05 DIAGNOSIS — F418 Other specified anxiety disorders: Secondary | ICD-10-CM

## 2024-04-05 DIAGNOSIS — F909 Attention-deficit hyperactivity disorder, unspecified type: Secondary | ICD-10-CM | POA: Diagnosis not present

## 2024-04-05 NOTE — Progress Notes (Signed)
 BH MD/PA/NP OP Progress Note  04/05/2024 9:59 AM Anne Ball  MRN:  [id number]  Chief Complaint: Routine Follow-up Virtual Visit via Video Note  I connected with Anne Ball on 04/05/24 at  9:00 AM EST by a video enabled telemedicine application and verified that I am speaking with the correct person using two identifiers.  Location: Patient: [address]  [place] [address]  Provider: [facility] office of provider   I discussed the limitations of evaluation and management by telemedicine and the availability of in person appointments. The patient expressed understanding and agreed to proceed.    I discussed the assessment and treatment plan with the patient. The patient was provided an opportunity to ask questions and all were answered. The patient agreed with the plan and demonstrated an understanding of the instructions.   The patient was advised to call back or seek an in-person evaluation if the symptoms worsen or if the condition fails to improve as anticipated.  I provided 30 minutes of non-face-to-face time during this encounter.   Dorn Jama Der, NP   HPI: 23 year old female presenting ARPA for follow-up.  Patient reports that she is doing well stating that the medication regimen is going well and she is doing well in school and enjoying her time with her family.  Patient reports that she has no new news stating that she has been really enjoying her time with her family and stating that she has not had any big issues anxiety or depression and reports that she is very satisfied currently with her performance.  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  Patient reports that she has no new complaints or concerns.  Patient denies SI, HI, AVH.  Patient is in agreement with treatment plan.  Patient with no other questions or concerns.  Patient to follow-up in 1 month due to the  provider transferring, patient has been explained that she can either stay at the clinic or be transferred to this provider's next practice for continuity of care. Visit Diagnosis:    ICD-10-CM   1. Attention deficit hyperactivity disorder (ADHD), predominantly inattentive type  F90.0     2. Other specified anxiety disorders  F41.8     3. Recurrent major depressive disorder, in partial remission  F33.41     4. Autism spectrum disorder  F84.0       Past Psychiatric History:  Inpatient: None, One ER visit for depression in 06/2018 at [facility] RTC: None Outpatient:     - Meds: At present takes Prozac  20 mg daily    - Therapy: Started seeing Ms. Lorrene for therapy Hx of SI/HI: No hx of SA or suicidal ideations. No hx of HI.   Past Medical History:  Past Medical History:  Diagnosis Date   Anxiety    Asthma    Depression    Fifth nerve palsy    Papilledema associated with increased intracranial pressure    Pseudotumor cerebri     Past Surgical History:  Procedure Laterality Date   NO PAST SURGERIES      Family Psychiatric History: No additional  Family History:  Family History  Problem Relation Age of Onset   Anxiety disorder Mother    Depression Mother    Diabetes Mother    Hypertension Mother    Hyperlipidemia Mother    Asthma Father    Migraines Neg Hx    Seizures Neg  Hx    Autism Neg Hx    ADD / ADHD Neg Hx    Bipolar disorder Neg Hx    Schizophrenia Neg Hx     Social History:  Social History   Socioeconomic History   Marital status: Single    Spouse name: Not on file   Number of children: Not on file   Years of education: Not on file   Highest education level: 10th grade  Occupational History   Not on file  Tobacco Use   Smoking status: Never   Smokeless tobacco: Never  Vaping Use   Vaping status: Never Used  Substance and Sexual Activity   Alcohol use: Never   Drug use: Never   Sexual activity: Never  Other Topics Concern   Not on file  Social  History Narrative   Lives with mom, dad and brother. She is in the 12th grade at [employer]. She enjoys eating, sleeping, and drawing.   Social Drivers of Corporate Investment Banker Strain: Low Risk  (07/03/2023)   Received from [facility]   Overall Financial Resource Strain (CARDIA)    Difficulty of Paying Living Expenses: Not very hard  Food Insecurity: No Food Insecurity (07/14/2023)   Hunger Vital Sign    Worried About Running Out of Food in the Last Year: Never true    Ran Out of Food in the Last Year: Never true  Transportation Needs: No Transportation Needs (07/14/2023)   PRAPARE - Administrator, Civil Service (Medical): No    Lack of Transportation (Non-Medical): No  Physical Activity: Inactive (09/12/2018)   Exercise Vital Sign    Days of Exercise per Week: 0 days    Minutes of Exercise per Session: 0 min  Stress: Stress Concern Present (09/12/2018)   [employer] of Occupational Health - Occupational Stress Questionnaire    Feeling of Stress : Rather much  Social Connections: Moderately Isolated (07/14/2023)   Social Connection and Isolation Panel    Frequency of Communication with Friends and Family: Twice a week    Frequency of Social Gatherings with Friends and Family: More than three times a week    Attends Religious Services: 1 to 4 times per year    Active Member of [employer] or Organizations: No    Attends Banker Meetings: Never    Marital Status: Never married    Allergies:  Allergies  Allergen Reactions   Cat Dander    Dog Epithelium (Canis Lupus Familiaris)    Gramineae Pollens    Horse Epithelium     Metabolic Disorder Labs: Lab Results  Component Value Date   HGBA1C 5.7 08/19/2011   No results found for: PROLACTIN Lab Results  Component Value Date   CHOL 109 08/19/2011   TRIG 48 08/19/2011   HDL 35 (L) 08/19/2011   VLDL 10 08/19/2011   LDLCALC 64 08/19/2011   Lab Results  Component Value  Date   TSH 1.065 12/29/2017    Therapeutic Level Labs: No results found for: LITHIUM No results found for: VALPROATE No results found for: CBMZ  Current Medications: Current Outpatient Medications  Medication Sig Dispense Refill   acetaZOLAMIDE  (DIAMOX ) 250 MG tablet Take 2 tablets (500 mg total) by mouth 2 (two) times daily. 120 tablet 1   Albuterol  (VENTOLIN  IN) Inhale 2 puffs into the lungs every 4 (four) hours as needed (shortness of breath).      Cholecalciferol (VITAMIN D3) 50 MCG (2000 UT) capsule Take  2,000 Units by mouth 2 (two) times daily.     FLUoxetine  (PROZAC ) 20 MG tablet TAKE 1 TABLET BY MOUTH EVERY DAY 30 tablet 2   FLUoxetine  (PROZAC ) 20 MG tablet Take 1 tablet (20 mg total) by mouth daily. 30 tablet 2   fluticasone (FLONASE) 50 MCG/ACT nasal spray Place 2 sprays into both nostrils daily as needed for allergies or rhinitis.  3   metFORMIN (GLUCOPHAGE-XR) 500 MG 24 hr tablet Take 1,000 mg by mouth 2 (two) times daily.     methylphenidate  (CONCERTA ) 54 MG PO CR tablet Take 1 tablet (54 mg total) by mouth every morning. 30 tablet 0   montelukast (SINGULAIR) 10 MG tablet Take 10 mg by mouth at bedtime.     QVAR REDIHALER 80 MCG/ACT inhaler Inhale 2 puffs into the lungs 2 (two) times daily.     No current facility-administered medications for this visit.     Musculoskeletal: Strength & Muscle Tone: within normal limits Gait & Station: normal Patient leans: N/A   Psychiatric Specialty Exam: Review of Systems  Constitutional: Negative.   HENT: Negative.    Eyes: Negative.   Respiratory: Negative.    Cardiovascular: Negative.   Gastrointestinal: Negative.   Endocrine: Negative.   Genitourinary: Negative.   Musculoskeletal: Negative.   Skin: Negative.   Allergic/Immunologic: Negative.   Neurological: Negative.   Hematological: Negative.   Psychiatric/Behavioral:  Positive for dysphoric mood. The patient is nervous/anxious.     Blood pressure 124/86,  pulse 83, temperature 97.9 F (36.6 C), temperature source Temporal, height 4' 11 (1.499 m), weight 255 lb (115.7 kg), last menstrual period 01/02/2024, SpO2 100%.Body mass index is 51.5 kg/m.  General Appearance: Well Groomed  Eye Contact:  Good  Speech:  Clear and Coherent  Volume:  Normal  Mood:  Anxious and Depressed  Affect:  Negative  Thought Process:  Coherent  Orientation:  Full (Time, Place, and Person)  Thought Content:  Logical  Suicidal Thoughts:  No  Homicidal Thoughts:  No  Memory:  Immediate;   Good Recent;   Good Remote;   Good  Judgement:  Good  Insight:  Good  Psychomotor Activity:  Normal  Concentration:  Concentration: Good and Attention Span: Good  Recall:  Good  Fund of Knowledge:Good  Language: Good  Akathisia:  No  Handed:  Right  AIMS (if indicated):    Assets:  Desire for Improvement Financial Resources/Insurance Housing  ADL's:  Intact  Cognition: WNL  Sleep:  Good   Screenings: GAD-7    Flowsheet Row Office Visit from 01/09/2024 in [facility] Office Visit from 01/03/2022 in confidential department  Total GAD-7 Score 8 17   PHQ2-9    Flowsheet Row Office Visit from 01/09/2024 in [facility] Office Visit from 01/03/2022 in confidential department Office Visit from 09/27/2021 in confidential department Office Visit from 08/16/2021 in confidential department Nutrition from 07/01/2021 in [facility] at [facility] Total Score 3 5 3 2  0  PHQ-9 Total Score 13 20 12 12  --   Flowsheet Row ED to Hosp-Admission (Discharged) from 07/13/2023 in [facility] 1C MEDICAL TELEMETRY Office Visit from 01/03/2022 in confidential department Office Visit from 09/27/2021 in confidential department  C-SSRS RISK CATEGORY No Risk No Risk No Risk     Assessment and Plan:  This is a 23 year old African-American female with  psychiatric history significant for major depressive disorder, anxiety, ADHD, ASD.  Previous foster current medications and she appears to remain compliant with her medications, appears to have stability with mood and anxiety in the community continue with them as mentioned below in the plan.  She will be seeing psychiatry nurse petitioner moving forward for medication management at this clinic.     Plan as below.   #1 Depression (recurrent, in partial remission) -Take Prozac  20 mg daily - She has supportive parents which is a good prognostic and protective factor.     #2 Anxiety (stable) - Her presentation appears most consistent with generalized anxiety and social anxiety disorders. - Recommending medication and therapy as mentioned above.    #3 ADHD (chronic and stable) -  Continue with Concerta  54 mg once a day    #4 Autism (Chronic, stable) - Testing done at [facility], and diagnosed with ASD per report.  -  Currently in career college course at [employer], will start a certificate course this fall.    #5 Pseudotumor Cerbri (stable per pt) - Defer management to neuro and opthalmology.  - Following with endo and nutrition service due to obesity and prediabetes.  - On metformin now however also does not seem to be adherent to it.     Patient/Guardian was advised Release of Information must be obtained prior to any record release in order to collaborate their care with an outside provider. Patient/Guardian was advised if they have not already done so to contact the registration department to sign all necessary forms in order for us  to release information regarding their care.   Consent: Patient/Guardian gives verbal consent for treatment and assignment of benefits for services provided during this visit. Patient/Guardian expressed understanding and agreed to proceed.    Dorn Jama Der, NP 04/05/2024, 9:59 AM

## 2024-04-29 ENCOUNTER — Telehealth: Payer: Self-pay

## 2024-04-29 ENCOUNTER — Other Ambulatory Visit: Payer: Self-pay | Admitting: Psychiatry

## 2024-04-29 DIAGNOSIS — F900 Attention-deficit hyperactivity disorder, predominantly inattentive type: Secondary | ICD-10-CM

## 2024-04-29 MED ORDER — METHYLPHENIDATE HCL ER (OSM) 54 MG PO TBCR: 54.0000 mg | EXTENDED_RELEASE_TABLET | ORAL | 0 refills | Status: AC

## 2024-05-01 NOTE — Telephone Encounter (Signed)
 noted

## 2024-05-03 ENCOUNTER — Telehealth: Admitting: Psychiatry

## 2024-05-03 DIAGNOSIS — F3341 Major depressive disorder, recurrent, in partial remission: Secondary | ICD-10-CM

## 2024-05-03 DIAGNOSIS — F418 Other specified anxiety disorders: Secondary | ICD-10-CM | POA: Diagnosis not present

## 2024-05-03 DIAGNOSIS — F900 Attention-deficit hyperactivity disorder, predominantly inattentive type: Secondary | ICD-10-CM | POA: Diagnosis not present

## 2024-05-03 DIAGNOSIS — F840 Autistic disorder: Secondary | ICD-10-CM

## 2024-05-03 NOTE — Progress Notes (Signed)
 BH MD/PA/NP OP Progress Note  05/03/2024 9:09 AM Anne Ball  MRN:  [id number]  Chief Complaint: Routine follow-up Virtual Visit via Video Note  I connected with Anne Ball on 05/03/2024 at  9:00 AM EST by a video enabled telemedicine application and verified that I am speaking with the correct person using two identifiers.  Location: Patient: [address]  [place] [address]  Provider: [facility] Office of Provider   I discussed the limitations of evaluation and management by telemedicine and the availability of in person appointments. The patient expressed understanding and agreed to proceed.    I discussed the assessment and treatment plan with the patient. The patient was provided an opportunity to ask questions and all were answered. The patient agreed with the plan and demonstrated an understanding of the instructions.   The patient was advised to call back or seek an in-person evaluation if the symptoms worsen or if the condition fails to improve as anticipated.  I provided 30 minutes of non-face-to-face time during this encounter.   Dorn Jama Der, NP   HPI: 23 year old female presenting ARPA for follow-up.  Patient reports that she is doing well stating that she just wrapped up school and is excited to be done with her school assignments and did well in her classes.  Patient reports that the current medication regimen is going well and she does not need any changes reporting that she is feeling great with her mood 10 out of 10 for today.  Patient reports that she is not having any new issues or any complications with her medical issues as well.  Patient reports that she has not she has been talking to her mother deciding whether or not she can move onto the practice with this provider or not.  Patient reports they have not made a decision yet so this provider will follow-up with them regarding the next steps.  Patient has been given the contact information for the  providers next practice.  Based on this assessment interview patient to continue current medication regimen.  Patient with no other question concerns at this time.  Patient is in agreement with treatment plan.  Patient with no follow-up at this time patient will either transfer to another provider or follow this provider to the next practice. Visit Diagnosis:    ICD-10-CM   1. Attention deficit hyperactivity disorder (ADHD), predominantly inattentive type  F90.0     2. Other specified anxiety disorders  F41.8     3. Recurrent major depressive disorder, in partial remission  F33.41     4. Autism spectrum disorder  F84.0       Past Psychiatric History:  Inpatient: None, One ER visit for depression in 06/2018 at [facility] RTC: None Outpatient:     - Meds: At present takes Prozac  20 mg daily    - Therapy: Started seeing Ms. Lorrene for therapy Hx of SI/HI: No hx of SA or suicidal ideations. No hx of HI.   Past Medical History:  Past Medical History:  Diagnosis Date   Anxiety    Asthma    Depression    Fifth nerve palsy    Papilledema associated with increased intracranial pressure    Pseudotumor cerebri     Past Surgical History:  Procedure Laterality Date   NO PAST SURGERIES      Family Psychiatric History: No additional  Family History:  Family History  Problem Relation Age of Onset   Anxiety disorder Mother  Depression Mother    Diabetes Mother    Hypertension Mother    Hyperlipidemia Mother    Asthma Father    Migraines Neg Hx    Seizures Neg Hx    Autism Neg Hx    ADD / ADHD Neg Hx    Bipolar disorder Neg Hx    Schizophrenia Neg Hx     Social History:  Social History   Socioeconomic History   Marital status: Single    Spouse name: Not on file   Number of children: Not on file   Years of education: Not on file   Highest education level: 10th grade  Occupational History   Not on file  Tobacco Use   Smoking status: Never   Smokeless tobacco: Never  Vaping  Use   Vaping status: Never Used  Substance and Sexual Activity   Alcohol use: Never   Drug use: Never   Sexual activity: Never  Other Topics Concern   Not on file  Social History Narrative   Lives with mom, dad and brother. She is in the 12th grade at [employer]. She enjoys eating, sleeping, and drawing.   Social Drivers of Health   Tobacco Use: Low Risk  (03/12/2024)   Received [facility]   Patient History    Smoking Tobacco Use: Never    Smokeless Tobacco Use: Never    Passive Exposure: Not on file  Financial Resource Strain: Low Risk  (07/03/2023)   Received from [facility]   Overall Financial Resource Strain (CARDIA)    Difficulty of Paying Living Expenses: Not very hard  Food Insecurity: No Food Insecurity (07/14/2023)   Hunger Vital Sign    Worried About Running Out of Food in the Last Year: Never true    Ran Out of Food in the Last Year: Never true  Transportation Needs: No Transportation Needs (07/14/2023)   PRAPARE - Administrator, Civil Service (Medical): No    Lack of Transportation (Non-Medical): No  Physical Activity: Not on file  Stress: Not on file  Social Connections: Moderately Isolated (07/14/2023)   Social Connection and Isolation Panel    Frequency of Communication with Friends and Family: Twice a week    Frequency of Social Gatherings with Friends and Family: More than three times a week    Attends Religious Services: 1 to 4 times per year    Active Member of [employer] or Organizations: No    Attends Banker Meetings: Never    Marital Status: Never married  Depression (PHQ2-9): High Risk (01/09/2024)   Depression (PHQ2-9)    PHQ-2 Score: 13  Alcohol Screen: Not on file  Housing: Low Risk (07/14/2023)   Housing Stability Vital Sign    Unable to Pay for Housing in the Last Year: No    Number of Times Moved in the Last Year: 0    Homeless in the Last Year: No  Utilities: Not At Risk  (07/14/2023)   AHC Utilities    Threatened with loss of utilities: No  Health Literacy: Not on file    Allergies: Allergies[1]  Metabolic Disorder Labs: Lab Results  Component Value Date   HGBA1C 5.7 08/19/2011   No results found for: PROLACTIN Lab Results  Component Value Date   CHOL 109 08/19/2011   TRIG 48 08/19/2011   HDL 35 (L) 08/19/2011   VLDL 10 08/19/2011   LDLCALC 64 08/19/2011   Lab Results  Component Value Date  TSH 1.065 12/29/2017    Therapeutic Level Labs: No results found for: LITHIUM No results found for: VALPROATE No results found for: CBMZ  Current Medications: Current Outpatient Medications  Medication Sig Dispense Refill   acetaZOLAMIDE  (DIAMOX ) 250 MG tablet Take 2 tablets (500 mg total) by mouth 2 (two) times daily. 120 tablet 1   Albuterol  (VENTOLIN  IN) Inhale 2 puffs into the lungs every 4 (four) hours as needed (shortness of breath).      Cholecalciferol (VITAMIN D3) 50 MCG (2000 UT) capsule Take 2,000 Units by mouth 2 (two) times daily.     FLUoxetine  (PROZAC ) 20 MG tablet TAKE 1 TABLET BY MOUTH EVERY DAY 30 tablet 2   FLUoxetine  (PROZAC ) 20 MG tablet Take 1 tablet (20 mg total) by mouth daily. 30 tablet 2   fluticasone (FLONASE) 50 MCG/ACT nasal spray Place 2 sprays into both nostrils daily as needed for allergies or rhinitis.  3   metFORMIN (GLUCOPHAGE-XR) 500 MG 24 hr tablet Take 1,000 mg by mouth 2 (two) times daily.     methylphenidate  (CONCERTA ) 54 MG PO CR tablet Take 1 tablet (54 mg total) by mouth every morning. 30 tablet 0   montelukast (SINGULAIR) 10 MG tablet Take 10 mg by mouth at bedtime.     QVAR REDIHALER 80 MCG/ACT inhaler Inhale 2 puffs into the lungs 2 (two) times daily.     No current facility-administered medications for this visit.     Musculoskeletal: Strength & Muscle Tone: within normal limits Gait & Station: normal Patient leans: N/A   Psychiatric Specialty Exam: Review of Systems  Constitutional:  Negative.   HENT: Negative.    Eyes: Negative.   Respiratory: Negative.    Cardiovascular: Negative.   Gastrointestinal: Negative.   Endocrine: Negative.   Genitourinary: Negative.   Musculoskeletal: Negative.   Skin: Negative.   Allergic/Immunologic: Negative.   Neurological: Negative.   Hematological: Negative.   Psychiatric/Behavioral:  Positive for dysphoric mood. The patient is nervous/anxious.      No vitals for this visit. Virtual visit.  General Appearance: Well Groomed  Eye Contact:  Good  Speech:  Clear and Coherent  Volume:  Normal  Mood:  Anxious and Depressed  Affect:  Negative  Thought Process:  Coherent  Orientation:  Full (Time, Place, and Person)  Thought Content:  Logical  Suicidal Thoughts:  No  Homicidal Thoughts:  No  Memory:  Immediate;   Good Recent;   Good Remote;   Good  Judgement:  Good  Insight:  Good  Psychomotor Activity:  Normal  Concentration:  Concentration: Good and Attention Span: Good  Recall:  Good  Fund of Knowledge:Good  Language: Good  Akathisia:  No  Handed:  Right  AIMS (if indicated):    Assets:  Desire for Improvement Financial Resources/Insurance Housing  ADL's:  Intact  Cognition: WNL  Sleep:  Good   Screenings: GAD-7    Flowsheet Row Office Visit from 01/09/2024 in [facility] Office Visit from 01/03/2022 in confidential department  Total GAD-7 Score 8 17   PHQ2-9    Flowsheet Row Office Visit from 01/09/2024 in [facility] Office Visit from 01/03/2022 in confidential department Office Visit from 09/27/2021 in confidential department Office Visit from 08/16/2021 in confidential department Nutrition from 07/01/2021 in [facility] Diabetes Education [facility] at [facility] Total Score 3 5 3 2  0  PHQ-9 Total Score 13 20 12 12  --   [employer]  ED to Hosp-Admission (Discharged) from 07/13/2023 in [facility] 1C MEDICAL TELEMETRY Office Visit from 01/03/2022 in confidential department Office Visit from 09/27/2021 in confidential department  C-SSRS RISK CATEGORY No Risk No Risk No Risk     Assessment and Plan:  This is a 23 year old African-American female with psychiatric history significant for major depressive disorder, anxiety, ADHD, ASD.  Previous foster current medications and she appears to remain compliant with her medications, appears to have stability with mood and anxiety in the community continue with them as mentioned below in the plan.  She will be seeing psychiatry nurse petitioner moving forward for medication management at this clinic.     Plan as below.   #1 Depression (recurrent, in partial remission) -Take Prozac  20 mg daily - She has supportive parents which is a good prognostic and protective factor.     #2 Anxiety (stable) - Her presentation appears most consistent with generalized anxiety and social anxiety disorders. - Recommending medication and therapy as mentioned above.    #3 ADHD (chronic and stable) -  Continue with Concerta  54 mg once a day    #4 Autism (Chronic, stable) - Testing done at [facility], and diagnosed with ASD per report.  -  Currently in career college course at [employer], will start a certificate course this fall.    #5 Pseudotumor Cerbri (stable per pt)    Patient/Guardian was advised Release of Information must be obtained prior to any record release in order to collaborate their care with an outside provider. Patient/Guardian was advised if they have not already done so to contact the registration department to sign all necessary forms in order for us  to release information regarding their care.   Consent: Patient/Guardian gives verbal consent for treatment and assignment of benefits for services provided during this visit. Patient/Guardian expressed understanding and agreed to proceed.    Dorn Jama Der,  NP 05/03/2024, 9:09 AM     [1]  Allergies Allergen Reactions   Cat Dander    Dog Epithelium (Canis Lupus Familiaris)    Gramineae Pollens    Horse Epithelium
